# Patient Record
Sex: Female | Born: 1995 | Race: Black or African American | Hispanic: No | Marital: Single | Smoking: Never smoker
Health system: Southern US, Community
[De-identification: ages and names within clinical notes are randomized; demographics above are authoritative.]

## PROBLEM LIST (undated history)

## (undated) DIAGNOSIS — G43909 Migraine, unspecified, not intractable, without status migrainosus: Secondary | ICD-10-CM

## (undated) HISTORY — DX: Migraine, unspecified, not intractable, without status migrainosus: G43.909

## (undated) HISTORY — PX: NO PAST SURGERIES: SHX2092

---
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
## 2019-07-29 ENCOUNTER — Encounter: Payer: Self-pay | Admitting: Internal Medicine

## 2019-08-01 ENCOUNTER — Telehealth: Payer: Self-pay

## 2019-08-01 NOTE — Telephone Encounter (Signed)
Called patient to do their pre-visit COVID screening.  Call went to voicemail. Unable to do prescreening.  

## 2019-08-04 ENCOUNTER — Encounter: Payer: Self-pay | Admitting: Internal Medicine

## 2019-08-04 ENCOUNTER — Ambulatory Visit (INDEPENDENT_AMBULATORY_CARE_PROVIDER_SITE_OTHER): Payer: 59 | Admitting: Internal Medicine

## 2019-08-04 ENCOUNTER — Other Ambulatory Visit: Payer: Self-pay

## 2019-08-04 VITALS — BP 119/73 | HR 79 | Temp 97.2°F | Resp 17 | Ht 63.0 in | Wt 206.0 lb

## 2019-08-04 DIAGNOSIS — M25572 Pain in left ankle and joints of left foot: Secondary | ICD-10-CM | POA: Diagnosis not present

## 2019-08-04 DIAGNOSIS — H9201 Otalgia, right ear: Secondary | ICD-10-CM

## 2019-08-04 DIAGNOSIS — Z7689 Persons encountering health services in other specified circumstances: Secondary | ICD-10-CM

## 2019-08-04 MED ORDER — MELOXICAM 7.5 MG PO TABS: 7.5000 mg | ORAL_TABLET | Freq: Every day | ORAL | 2 refills | Status: DC | PRN

## 2019-08-04 NOTE — Progress Notes (Signed)
Subjective:    Jenna Jacobson - 24 y.o. female MRN [id number]  Date of birth: 1995/09/02  HPI  Jenna Jacobson is to establish care. Patient has a PMH significant for none.   Patient is endorsing ear pain. Present on right side. Present since December. Feels like she has water left in the ear. Thought it was ear wax and has been using ear cleaner stuff--Durex drops without relief. Did not go swimming, use bathtub or hot tub. Denies postnasal drip, rhinorrhea, congestion. Occasional headache.   Ankle Pain: Twisted her left ankle in Oct while skipping and it rolled up underneath her. Pain is located on lateral aspect. If she walks on it a long time or flexes her foot she feels the pain. Never sought medical care for this. Wearing a brace made it worse. Used ice/heat with some improvement.    ROS per HPI     Health Maintenance:  Health Maintenance Due  Topic Date Due  . HIV Screening  08/27/2008  . TETANUS/TDAP  08/27/2012  . PAP-Cervical Cytology Screening  08/28/2014  . PAP SMEAR-Modifier  08/28/2014  . INFLUENZA VACCINE  01/18/2019     Past Medical History: There are no problems to display for this patient.     Social History   reports that she has never smoked. She has never used smokeless tobacco. She reports current alcohol use.   Family History  family history is not on file.   Medications: reviewed and updated   Objective:   Physical Exam BP 119/73   Pulse 79   Temp (!) 97.2 F (36.2 C) (Temporal)   Resp 17   Ht 5\' 3"  (1.6 m)   Wt 206 lb (93.4 kg)   SpO2 99%   BMI 36.49 kg/m  Physical Exam  Constitutional: She is oriented to person, place, and time and well-developed, well-nourished, and in no distress. No distress.  HENT:  Head: Normocephalic and atraumatic.  Right Ear: External ear normal.  Left Ear: External ear normal.  Left ear with some minimal cerumen but able to visualize TM. Right canal impacted with cerumen; unable to visualize TM. No signs of  acute otitis externa bilaterally. After disimpaction, right TM visualized and normal.   Eyes: Conjunctivae and EOM are normal.  Cardiovascular: Normal rate, regular rhythm and normal heart sounds.  No murmur heard. Pulmonary/Chest: Effort normal and breath sounds normal. No respiratory distress.  Musculoskeletal:        General: Normal range of motion.     Comments: Left ankle without edema or erythema. Good joint stability. Full ROM and strength. Pain is re-created with flexion and lateral deviation of toes.  Neurological: She is alert and oriented to person, place, and time.  Skin: Skin is warm and dry. She is not diaphoretic.  Psychiatric: Affect and judgment normal.        Assessment & Plan:    1. Encounter to establish care  2. Ear pain, right Suspect related to cerumen impaction. Disimpaction performed by Allyne Gee, CMA and symptoms resolved. Follow up prn.   3. 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 Ankle joint feels stable and patient able to maintain full strength and ROM. NSAIDs prescribed to decrease inflammation. Encouraged PT evaluation. Patient would like to think about it prior to committing.  - meloxicam (MOBIC) 7.5 MG tablet; Take 1 tablet (7.5 mg total) by mouth daily as needed for pain.  Dispense: 30 tablet;  Refill: 2    Marcy Siren, D.O. 08/04/2019, 9:49 AM Primary [facility] at [facility]

## 2019-08-04 NOTE — Patient Instructions (Addendum)
**Note Jenna-Identified via Obfuscation** I would take the Mobic once per day for two weeks to decrease inflammation and then take it as needed if you have a particularly painful day with your ankle.    Peroneal Tendinopathy  Peroneal tendinopathy is irritation of the tendons that pass behind your ankle (peroneal tendons). These tendons attach muscles in your foot to a bone on the side of your foot and underneath the arch of your foot. This condition can cause your peroneal tendons to get bigger and swell. What are the causes? This condition may be caused by:  Putting stress on your ankle over and over again (overuse injury).  A sudden injury that puts stress on your tendons, such as an ankle sprain. What increases the risk? You are more likely to develop this condition if you:  Have high arches.  Play sports that involve putting stress on the ankle over and over again. These sports include: ? Running. ? Dancing. ? Soccer. ? Basketball. What are the signs or symptoms? Symptoms of this condition can start suddenly or develop gradually. Symptoms of this condition include:  Pain in the back of the ankle, on the side of the foot, or in the arch of the foot.  Pain that gets worse with activity and better with rest.  Swelling.  Warmth.  Weakness in your foot or ankle. How is this diagnosed? This condition may be diagnosed based on:  Your symptoms.  Your medical history.  A physical exam. During the exam, your health care provider may move your foot and ankle and test the strength of your leg muscles.  Imaging tests, such as: ? X-rays or a CT scan to check for bone injury. ? MRI or ultrasound to check for muscle or tendon injury. How is this treated? This condition may be treated by:  Keeping your body weight off your ankle for several days.  Returning to full activity gradually.  Putting ice on your ankle to reduce swelling.  Taking NSAIDs, such as ibuprofen.  Having medicine injected into your tendon to  reduce swelling.  Wearing a removable boot or brace for ankle support.  Doing range-of-motion exercises and strengthening exercises (physical therapy) when pain and swelling improve. If the condition does not improve with treatment, or if a tendon or muscle is damaged, surgery may be needed. Follow these instructions at home: If you have a boot or brace:  Wear the boot or brace as told by your health care provider. Remove it only as told by your health care provider.  Loosen the boot or brace if your toes tingle, become numb, or turn cold and blue.  Keep the boot or brace clean.  If the boot or brace is not waterproof: ? Do not let it get wet. ? Cover it with a watertight covering when you take a bath or shower. Managing pain, stiffness, and swelling   If directed, put ice on the injured area. ? If you have a removable boot or brace, remove it as told by your health care provider. ? Put ice in a plastic bag. ? Place a towel between your skin and the bag. ? Leave the ice on for 20 minutes, 2-3 times a day.  Move your toes often to reduce stiffness and swelling.  Raise (elevate) your ankle above the level of your heart while you are sitting or lying down. Activity  Do not do activities that make pain or swelling worse.  Do exercises as told by your health care provider.  Return to  your normal activities as told by your health care provider. Ask your health care provider what activities are safe for you.  Ask your health care provider when it is safe to drive if you have a boot or brace on your foot. General instructions  Take over-the-counter and prescription medicines only as told by your health care provider.  Do not use any products that contain nicotine or tobacco, such as cigarettes, e-cigarettes, and chewing tobacco. These can delay healing. If you need help quitting, ask your health care provider.  Keep all follow-up visits as told by your health care provider. This  is important. How is this prevented?  Wear supportive footwear that is appropriate for your athletic activity.  Avoid athletic activities that cause swelling or pain in your ankle or foot.  See your health care provider if you have pain or swelling that does not improve after a few days of rest.  Stop training if you develop pain or swelling.  If you start a new athletic activity, start gradually to build up your strength, endurance, and flexibility.  Warm up and stretch before being active.  Cool down and stretch after being active. Contact a health care provider if:  Your symptoms get worse.  Your symptoms do not improve in 2-4 weeks.  You develop new, unexplained symptoms. Summary  Peroneal tendinopathy is irritation of the tendons that pass behind your ankle.  This condition is caused by overuse or sudden injury to the peroneal tendon.  Symptoms include pain, swelling, warmth, and weakness in your foot or ankle.  This condition is treated with rest, ice, medicines, physical therapy, and surgery if needed. This information is not intended to replace advice given to you by your health care provider. Make sure you discuss any questions you have with your health care provider. Document Revised: 09/26/2018 Document Reviewed: 07/15/2018 Elsevier Patient Education  2020 [employer].     Thank you for choosing Primary Care at [facility] to be your medical home!    Jenna Jacobson was seen by Jenna Hollingshead, DO today.   Cabin crew primary care provider is Jenna Siren, DO.   For the best care possible, you should try to see Jenna Siren, DO whenever you come to the clinic.   We look forward to seeing you again soon!  If you have any questions about your visit today, please call us at [phone] or feel free to reach your primary care provider via MyChart.

## 2019-09-15 ENCOUNTER — Telehealth: Payer: Self-pay

## 2019-09-15 NOTE — Telephone Encounter (Signed)
Called patient to do their pre-visit COVID screening.  Call went to voicemail. Unable to do prescreening.  

## 2019-09-16 ENCOUNTER — Ambulatory Visit: Payer: 59 | Admitting: Internal Medicine

## 2019-12-26 ENCOUNTER — Ambulatory Visit: Payer: 59 | Admitting: Family Medicine

## 2019-12-29 ENCOUNTER — Other Ambulatory Visit: Payer: Self-pay

## 2019-12-29 ENCOUNTER — Ambulatory Visit: Payer: 59 | Admitting: Family Medicine

## 2020-01-02 DIAGNOSIS — M222X1 Patellofemoral disorders, right knee: Secondary | ICD-10-CM | POA: Insufficient documentation

## 2020-05-03 ENCOUNTER — Ambulatory Visit: Payer: Self-pay

## 2022-06-19 NOTE — L&D Delivery Note (Signed)
Delivery Note:   Chief Financial Officer G1P0 at [redacted]w[redacted]d  Admitting diagnosis: Post-dates pregnancy [O48.0]  First Stage: Induction of labor: Yes Onset of labor: 04/27/23 at 2300 Analgesia /Anesthesia/Pain control intrapartum: Epidural  Second Stage: Consented for waterbirth but never entered tub.   Complete dilation at 04/28/2023 at 4:30 AM Birth parent pushing self directed. Supported by FOB- Scottie FHR monitored continuously and was appropriate throughout pushing but did have notable deep variables. Infant noted to be OP.  I attempted to manually rotate the infant.  This appears successful but infant was still slow to descend.   Consented for VAVD.  Indication for operative vaginal delivery: Maternal exhaustion, fetal position  Risks of vacuum assistance were discussed in detail, including but not limited to, bleeding, infection, damage to maternal tissues, fetal cephalohematoma, inability to effect vaginal delivery of the head or shoulder dystocia that cannot be resolved by established maneuvers and need for emergency cesarean section.  Patient gave verbal consent.  Patient was examined and found to be fully dilated with fetal station of +2. The soft vacuum soft cup was positioned over the sagittal suture 3 cm anterior to posterior fontanelle.  Pressure was then increased to 500 mmHg, and the patient was instructed to push.  Pulling was administered along the pelvic curve.  3 pull was administered during 3 contractions, 0 popoffs using Kiwi extractor. I then transitioned to MityVac to see if this improved the descent.  The infant was then delivered atraumatically.  Nuchal Cord: Yes  Delivery of a Live born female  Birth Weight:   APGAR: 7, 8  Newborn Delivery   Birth date/time: 04/28/2023 09:06:00 Delivery type: Vaginal, Vacuum (Extractor)     Infant delivered in cephalic presentation, in LOA position and shoulders delivered easily. Placed on birth parent chest but noted to have poor tone and  HR and cord was clamp about 30 seconds.  Double clamped by Federico Flake, MD  Collection of cord blood for typing completed. Cord blood donation-None Venous cord blood sample-Yes - collected due to low APGAR and variable.  Ph 7.15  Third Stage: Placenta delivered Spontaneous;Expressed;Pathology with 3 vessels. Uterine tone firm, bleeding Small Uterotonics: Pitocin IV Placenta to Pathology  3rd degree laceration identified.  Episiotomy:None Local analgesia: None  Repair: 3rd, 3A Est. Blood Loss (mL):242.00  Complications: None   Mom to postpartum.  Johnn Hai to Couplet care / Skin to Skin.  Delivery Report:  Review the Delivery Report for details.     Signed: Federico Flake, MD  04/28/2023, 9:50 AM

## 2022-10-11 ENCOUNTER — Ambulatory Visit
Admission: EM | Admit: 2022-10-11 | Discharge: 2022-10-11 | Disposition: A | Discharge status: Home / Self Care | Payer: Self-pay | Attending: Internal Medicine | Admitting: Internal Medicine

## 2022-10-11 DIAGNOSIS — Z3201 Encounter for pregnancy test, result positive: Secondary | ICD-10-CM

## 2022-10-11 LAB — POCT URINE PREGNANCY: Preg Test, Ur: POSITIVE — AB

## 2022-10-11 NOTE — Discharge Instructions (Signed)
Your urine pregnancy test was positive.  Please continue prenatal vitamins and follow-up with OB/GYN for further evaluation and management.

## 2022-10-11 NOTE — ED Triage Notes (Signed)
Pt reports preg(+) at home but ob/gyn requires a result entered into the chart. Here for upreg.

## 2022-10-11 NOTE — ED Provider Notes (Signed)
[facility]    CSN: [id number] Arrival date & time: 10/11/22  0951      History   Chief Complaint Chief Complaint  Patient presents with   Possible Pregnancy    HPI Jenna Jacobson is a 27 y.o. female.   Patient presents today to receive a pregnancy test.  She reports that she took a positive pregnancy test in March.  Her last menstrual cycle was around 07/31/2022.  She states that she has been attempting to get in with OB/GYN but they are requiring a positive pregnancy test by a medical provider before they will see her.  She states that she has had a little bit of nausea and breast tenderness but otherwise has been feeling well.  Has been taking prenatal vitamins.  She denies that she takes any daily prescription medications.  She also denies that she smokes cigarettes.  Patient is not reporting any abdominal pain or abnormal vaginal bleeding.   Possible Pregnancy    Past Medical History:  Diagnosis Date   Migraines     There are no problems to display for this patient.   Past Surgical History:  Procedure Laterality Date   NO PAST SURGERIES      OB History     Gravida  1   Para      Term      Preterm      AB      Living         SAB      IAB      Ectopic      Multiple      Live Births               Home Medications    Prior to Admission medications   Medication Sig Start Date End Date Taking? Authorizing Provider  meloxicam (MOBIC) 7.5 MG tablet Take 1 tablet (7.5 mg total) by mouth daily as needed for pain. 08/04/19   Arvilla Market, MD    Family History Family History  Problem Relation Age of Onset   Hypertension Mother    Obesity Mother    Hypertension Father    Healthy Brother     Social History Social History   Tobacco Use   Smoking status: Never   Smokeless tobacco: Never  Substance Use Topics   Alcohol use: Yes     Allergies   Patient has no known allergies.   Review of Systems Review of  Systems Per HPI  Physical Exam Triage Vital Signs ED Triage Vitals [10/11/22 1023]  Enc Vitals Group     BP 133/76     Pulse Rate 84     Resp 16     Temp 98 F (36.7 C)     Temp Source Oral     SpO2 99 %     Weight      Height      Head Circumference      Peak Flow      Pain Score 0     Pain Loc      Pain Edu?      Excl. in GC?    No data found.  Updated Vital Signs BP 133/76 (BP Location: Left Arm)   Pulse 84   Temp 98 F (36.7 C) (Oral)   Resp 16   SpO2 99%   Visual Acuity Right Eye Distance:   Left Eye Distance:   Bilateral Distance:    Right Eye Near:   Left Eye  Near:    Bilateral Near:     Physical Exam Constitutional:      General: She is not in acute distress.    Appearance: Normal appearance. She is not toxic-appearing or diaphoretic.  HENT:     Head: Normocephalic and atraumatic.  Eyes:     Extraocular Movements: Extraocular movements intact.     Conjunctiva/sclera: Conjunctivae normal.  Pulmonary:     Effort: Pulmonary effort is normal.  Neurological:     General: No focal deficit present.     Mental Status: She is alert and oriented to person, place, and time. Mental status is at baseline.  Psychiatric:        Mood and Affect: Mood normal.        Behavior: Behavior normal.        Thought Content: Thought content normal.        Judgment: Judgment normal.      UC Treatments / Results  Labs (all labs ordered are listed, but only abnormal results are displayed) Labs Reviewed  POCT URINE PREGNANCY - Abnormal; Notable for the following components:      Result Value   Preg Test, Ur Positive (*)    All other components within normal limits    EKG   Radiology No results found.  Procedures Procedures (including critical care time)  Medications Ordered in UC Medications - No data to display  Initial Impression / Assessment and Plan / UC Course  I have reviewed the triage vital signs and the nursing notes.  Pertinent labs &  imaging results that were available during my care of the patient were reviewed by me and considered in my medical decision making (see chart for details).     Urine pregnancy test was positive.  Patient advised to continue prenatal vitamins and follow-up with OB/GYN for further evaluation and management.  No concerns for complications related to pregnancy at this time.  Patient verbalized understanding and was agreeable with plan. Final Clinical Impressions(s) / UC Diagnoses   Final diagnoses:  Positive urine pregnancy test     Discharge Instructions      Your urine pregnancy test was positive.  Please continue prenatal vitamins and follow-up with OB/GYN for further evaluation and management.    ED Prescriptions   None    PDMP not reviewed this encounter.   Gustavus Bryant, [place] 10/11/22 1102

## 2022-11-15 ENCOUNTER — Telehealth: Payer: Medicaid Other

## 2022-11-15 DIAGNOSIS — Z3689 Encounter for other specified antenatal screening: Secondary | ICD-10-CM

## 2022-11-15 DIAGNOSIS — Z3400 Encounter for supervision of normal first pregnancy, unspecified trimester: Secondary | ICD-10-CM | POA: Insufficient documentation

## 2022-11-15 DIAGNOSIS — Z3401 Encounter for supervision of normal first pregnancy, first trimester: Secondary | ICD-10-CM

## 2022-11-15 DIAGNOSIS — Z3402 Encounter for supervision of normal first pregnancy, second trimester: Secondary | ICD-10-CM

## 2022-11-15 MED ORDER — BLOOD PRESSURE KIT DEVI: 1.0000 | 0 refills | Status: DC | PRN

## 2022-11-15 NOTE — Progress Notes (Signed)
New OB Intake  I connected with Jenna Jacobson  on 11/15/22 at 10:15 AM EDT by MyChart Video Visit and verified that I am speaking with the correct person using two identifiers. Nurse is located at [facility] and pt is located at home.  I discussed the limitations, risks, security and privacy concerns of performing an evaluation and management service by telephone and the availability of in person appointments. I also discussed with the patient that there may be a patient responsible charge related to this service. The patient expressed understanding and agreed to proceed.  I explained I am completing New OB Intake today. We discussed EDD of 05/07/2023 that is based on LMP of 07/31/2022. Pt is G1/P0. I reviewed her allergies, medications, Medical/Surgical/OB history, and appropriate screenings. I informed her of [facility] services. [facility] information placed in AVS. Based on history, this is a low risk pregnancy.  There are no problems to display for this patient.   Concerns addressed today  Delivery Plans Plans to deliver at [facility]. Patient given information for [facility] Healthy Baby website for more information about [facility]. Patient is interested in water birth. Offered upcoming OB visit with CNM to discuss further.  MyChart/Babyscripts MyChart access verified. I explained pt will have some visits in office and some virtually. Babyscripts instructions given and order placed. Patient verifies receipt of registration text/e-mail. Account successfully created and app downloaded.  Blood Pressure Cuff/Weight Scale Blood pressure cuff ordered for patient to pick-up from [employer]. Explained after first prenatal appt pt will check weekly and document in Babyscripts.  Anatomy US Explained first scheduled [place] will be around 19 weeks. Anatomy US scheduled for 12/12/22 at 1:45pm. Pt notified to arrive at 1:30pm.  Labs Discussed Natera genetic screening with patient. Would like both  Panorama and Horizon drawn at new OB visit. Routine prenatal labs needed.  COVID Vaccine Patient has had COVID vaccine.   Is patient a CenteringPregnancy candidate?  Declined due to Enrolled in [facility]   Is patient a Mom+Baby Combined Care candidate?  Accepted   If accepted, Mom+Baby staff notified  Social Determinants of Health Food Insecurity: Patient denies food insecurity. WIC Referral: Patient is interested in referral to [facility].  Transportation: Patient denies transportation needs. Childcare: Discussed no children allowed at ultrasound appointments. Offered childcare services; patient declines childcare services at this time.  Interested in [place]? If yes, send referral and doula dot phrase.   First visit review I reviewed new OB appt with patient. I explained they will have a provider visit that includes new ob labs, listening to baby heartbeat and possible pap smear. Explained pt will be seen by Edd Jacobson, CNM at first visit; encounter routed to appropriate provider. Explained that patient will be seen by pregnancy navigator following visit with provider.   Lowry Bowl, CMA 11/15/2022  10:09 AM

## 2022-11-15 NOTE — Patient Instructions (Signed)
Safe Medications in Pregnancy   Acne:  Benzoyl Peroxide  Salicylic Acid   Backache/Headache:  Tylenol: 2 regular strength every 4 hours OR               2 Extra strength every 6 hours   Colds/Coughs/Allergies:  Benadryl (alcohol free) 25 mg every 6 hours as needed  Breath right strips  Claritin  Cepacol throat lozenges  Chloraseptic throat spray  Cold-Eeze- up to three times per day  Cough drops, alcohol free  Flonase (by prescription only)  Guaifenesin  Mucinex  Robitussin DM (plain only, alcohol free)  Saline nasal spray/drops  Sudafed (pseudoephedrine) & Actifed * use only after [redacted] weeks gestation and if you do not have high blood pressure  Tylenol  Vicks Vaporub  Zinc lozenges  Zyrtec   Constipation:  Colace  Ducolax suppositories  Fleet enema  Glycerin suppositories  Metamucil  Milk of magnesia  Miralax  Senokot  Smooth move tea   Diarrhea:  Kaopectate  Imodium A-D   *NO pepto Bismol   Hemorrhoids:  Anusol  Anusol HC  Preparation H  Tucks   Indigestion:  Tums  Maalox  Mylanta  Zantac  Pepcid   Insomnia:  Benadryl (alcohol free) 25mg every 6 hours as needed  Tylenol PM  Unisom, no Gelcaps   Leg Cramps:  Tums  MagGel   Nausea/Vomiting:  Bonine  Dramamine  Emetrol  Ginger extract  Sea bands  Meclizine  Nausea medication to take during pregnancy:  Unisom (doxylamine succinate 25 mg tablets) Take one tablet daily at bedtime. If symptoms are not adequately controlled, the dose can be increased to a maximum recommended dose of two tablets daily (1/2 tablet in the morning, 1/2 tablet mid-afternoon and one at bedtime).  Vitamin B6 100mg tablets. Take one tablet twice a day (up to 200 mg per day).   Skin Rashes:  Aveeno products  Benadryl cream or 25mg every 6 hours as needed  Calamine Lotion  1% cortisone cream   Yeast infection:  Gyne-lotrimin 7  Monistat 7    **If taking multiple medications, please check labels to avoid  duplicating the same active ingredients  **take medication as directed on the label  ** Do not exceed 4000 mg of tylenol in 24 hours  **Do not take medications that contain aspirin or ibuprofen             [place] Pediatric Providers  [place]outheast [place] ([address]) [facility] Brown, MD; Chambliss, MD; Eniola, MD; Hensel, MD; McDiarmid, MD; McIntyer, MD [address] [place], [place] [address] [phone] Mon-Fri 8:30-12:30, 1:30-5:00  Providers come to see babies during newborn hospitalization Only accepting infants of Mother's who are seen at Family Medicine Center or have siblings seen at   [facility] Center Medicaid - Yes; Tricare - Yes   [facility] Mulberry, MD [address] [place], [place] [address] [phone] Mon, Tue, Thur, Fri 8:30-5:00, Wed 10:00-7:00 (closed 1-2pm daily for lunch) Takes [place] residents with no insurance.  [place] [facility] only with Medicaid/insurance; Tricare - no  [facility] Ben-Davies, MD; Brown, MD; Chandler, MD; Ettefagh, MD; Grant, MD; Hanvey, MD; Herrin, MD; Jones,  MD; Lester, MD; McCormick, MD; McQueen, MD; Simha, MD; Stanley, MD; Stryffeler, NP [address], [place], [place] [address] [phone] Mon, Tue, Thur, Fri 8:30-5:30, Wed 9:30-5:30, Sat 8:30-12:30 Only accepting infants of first-time parents or siblings of   current patients Hospital discharge coordinator will make follow-up appointment Medicaid - yes; Tricare - yes  [place] ([address]) [facility] Cox, MD; Davis, MD; Dovico, MD; Ettefaugh, MD; Lowe, MD; Nation, MD; Slimp, MD; Sumner, MD; Williams, MD [address], [place], [place] [address] [phone] Mon-Fri 8:30-5:00, closed for lunch 12:30-1:30; Sat-Sun 10:00-1:00 Accepting Newborns with commercial insurance only, must call prior  to delivery to be accepted into  practice.  Medicaid - no, Tricare - yes   [facility] [address] [place], [place] [address] [phone] or [phone] Mon to Fri 8am to 10pm, Sat 8am to 1pm (virtual only on weekends) Only accepts Medicaid Healthy Blue pts  [facility]  Artis, MD; Coccaro, MD; Lockett Gardner, MD; Netherton, NP; Roper, MD; Wilmot, PA-C; Skinner, MD [address] [place], [place] [address] [phone] Mon-Fri 8:30-5:30 Medicaid - yes, Tricare - yes  [place] ([address]) [facility] Warner, MD [address], [place], [place] [address] [phone] Mon, Tues, Wed Fri 8:30-5:00, Sat 8:30-12:00, Closed Thursdays Accepting siblings of established patients and first time mom's if you call prenatally Medicaid- yes; Tricare - yes  [facility] Becker, PA; Hagler, MD; Quinn, PA-C; Scifres, PA; Sun, MD; Swayne, MD;  [address], [place], [place] [address] [phone] Mon-Fri 8:30-5:00, closed for lunch 1-2 Only accepting newborns of established patients Medicaid- no; Tricare - yes  [place] ([address]) [facility] Timberlake, MD; [address], [place], [place] [address] [phone] Mon-Fri 8:00-5:00 Medicaid - No; Tricare - Yes  [facility] [employer]  Brake, NP; Wharton, PA [address], [place], [place] [address] [phone] Mon-Fri 8:00-5:00 Medicaid - No, Tricare - Yes  [facility] Gay, MD; Quinlan, MD; Blatt, DNP [address] [place], [place] [address] [phone]  Mon-Fri 8:00-5:00 Medicaid - No; Tricare - Yes  [facility] [address] [place], [place] [address] [phone] Mon-Fri 8:30-5:00 (lunch 12:00-1:00) Medicaid -Yes; Tricare - Yes  [facility] Jordan, MD [address], [place], [place] [address] [phone] Mon-Fri 8:00-5:00 Seeing newborns of current patients only. No new patients Medicaid - No, Tricare - yes  [facility] Parker, MD [address] [place], [place] [address] [phone] Mon-Fri 8:00-5:00 Medicaid -yes as secondary coverage only; Tricare - yes  [facility] Brecken, PA; Christy, NP; Dees, MD; DeClaire, MD; DeWeese, MD; Hodge, PA; Smoot, NP; Summer, MD; Vapne, MD [address] [place], [place] [address] [phone] Mon-Fri 8:30-5:00, Sat 9:00-11:00 Accepts commercial insurance ONLY. Offers free prenatal information sessions for families. Medicaid - No, Tricare - Call first  [facility] Bouska, MD; Gordon, PA; Jeffery, PA; Weber, PA [address] [place] [address] [phone] Mon-Fri 7:30-5:30 Medicaid - Yes; Tricare - yes  [place] ([address] & [address])  [facility] Reese, MD [address] [place], [place] [address] [phone] Mon-Thur 8:00-6:00, closed for lunch 12-2, closed Fridays Medicaid - yes; Tricare - no  [facility] Anderson, NP; Badger, MD; Beal, PA; Spencer, PA [address], [place], [place] [address] [phone] Mon-Fri 7:30-4:30 Medicaid - yes, Tricare - yes  [facility]  Agbuya, MD; Klett, NP; Romgoolam, MD; Rothstein, NP [address], , [place] [address] [phone] Mon-Fri 8:30-5:00, closed for lunch 1-2, Sat 8:30-12:00 - sick visits only Providers come to   see babies at [facility] Only accepting newborns of siblings and first time parents ONLY if who have met with office prior to delivery Medicaid -Yes; Tricare - yes  [facility] [place]  Golden, DO; Friddle, NP; Wallace, MD; Wood, MD:  [address], [place], [place] [address] [phone] Mon- Fri 8:00-5:00, Sat 9:00-12:00 -  sick visits only Accepting siblings of established patients and first time mom/baby Medicaid - Yes; Tricare - yes Patients must have vaccinations (baby vaccines)  [place] ([address] & [address])  [facility] [address] [place], [place] [address] [phone] Mon-Fri 8:00-5:00 Medicaid - no; Tricare - yes  [facility] Briscoe, MD; Schmidt, PA; Moreira, PA [address], [place], [place] [address] [phone] Mon-Fri 8:00-5:00 Medicaid- yes; Tricare - yes  [facility] Boyd, MD; Jones, NP; Osborn, PA [address], , [place] [address] [phone] Mon-Fri 8:00-5:00 Medicaid - Yes; Tricare - yes  [place] ([address])  [facility] Atkinson, PA; Calderon, PA; Cummings, MD; Dillard, MD; Henrish, NP; Isenhour, DO; Martin, PA; Olson, MD; Ott, MD; Phillips, MD; Valente, PA; VanDeven, PA; Yonjof, NP [address], [place], [place] [address] [phone] Mon-Fri 8:30-5:00, Sat 9:00-12:00 - sick only Please register online [email or web address] then schedule online or call office Medicaid-Yes; Tricare -yes  [facility]  Dabrusco, MD; Dial, MD; Kouts, MD; Fleenor, NP; Goolsby, PA; Tonuzi, MD; Turner, NP; West, MD [address], [place], [place] [address] [phone] Mon-Fri 8:00-5:30, Sat&Sun by appointment (phones open at 8:30) Medicaid - Yes; Tricare - yes  [place] ([address]) [facility] Allen, CPNP; Bates, MD; Gordon, MD; Mills, NP; Weinshilboum, DO [address], [place], [place] [address] [phone] M-F 8:00 - 5:15, Sat/Sun 9-12 sick visits only Medicaid - No; Tricare - yes  [facility] [place] [facility]  Brown, PA-C; Cowen, PA-C; Dennis, DO; Fuster, PA-C; Martin, PA-C;  Shelton, PA-C; Spry, MD [address] [place] [address] [place], [place] [address] [phone] Mon-Thur 8:00-7:00, Fri 8:00-5:00 Accepting Medicaid for 13 and under only   [facility] [place] [facility] [place] Hayes, FNP; List, FNP; Moran, MD; Pitonzo, PA-C; Scholer, MD; Spangle, FNP; Nzenwa, FNP; Jasper, MD; Moran, MD [address] [place], [place] [address] [phone] Mon-Fri 8:30-5:30 Medicaid - Yes; Tricare - yes  [facility] [address] [facility]  Kelly, CPNP; Logan, MD; Poth, MD; Ramadoss, MD; Staton, NP [address], [place], [place] [address] [phone] Mon-Thur 8:00-5:30, Fri 8:00-5:00, Sat 9:00-12:00 Medicaid - yes, Tricare - yes  [place] ([address])  [facility] [place] Masneri, DO; Meyers, MD; Nelson, PA [address], [place], [place] [address] [phone] Mon-Fri 8:00-5:00, closed for lunch 12-1 Medicaid - No; Tricare - yes  [facility] [place] McGowen, MD [address], [place], [place] [address] [phone] Mon-Fri 8:00-5:00 Medicaid - No; Tricare - yes  [facility] [place] MacDonald, MD; Nayak, MD; Kearns, MD; Jones, MD [address] [place] [address], [place], [place] [address] [phone] Mon-Fri 8:00-5:00 Medicaid- Yes; Tricare - yes  [place] ([address])  [facility] [place] Martin, PA-C; Tabori, MD [address], [place],    [address] [phone] Mon-Fri 8:00-5:00 Medicaid - No; Tricare - yes  [facility] [place]  Margin - CPNP [address], [place], [place] [address] [phone] Mon-Weds 8:00-6:00, Thurs-Fri 8:00-5:00, Sat 9:00-12:00 Medicaid - yes; Tricare - yes   [facility] [place] Aubuchon, MD; Brandon, PA [address] [place], [place] [address] [phone] Mon-Fri 8:00-5:00 Medicaid - yes; Tricare - yes  [place] Pediatric Providers  [facility] [address], [place], [place] [address] [phone] M, Thur: 8am -8pm, Tues, Weds: 8am - 5pm; Fri: 8-1 Medicaid - Yes; Tricare - yes  [facility] Mertz, MD; Johnson, MD; Wells, MD; Downs, PA; Hockenberger, PA [address], [place], [place] [address] [phone] M-F 8:30 - 5:00 Medicaid - Call office; Tricare -yes  [facility] Bonney, MD; Page, MD, Minter, MD; Mueller, PNP; Thomason, NP [address], [place], [place] [address] [phone] M-F 8:30 - 5:00, Sat/Sun 8:30 - 12:30 (sick visits) Medicaid - Call office; Tricare -yes  [facility] Lewis, MD; Shaub, PNP; Boylston, MD; Quaile, PA; Nonato, NP; Landon, CPNP [address], [place], [place] [address] [phone] M-F 8:30 - 5:00 Medicaid - Call office; Tricare - yes  [facility] [place] Cline, MD; Dvergsten, MD; Flores, MD; Kawatu, MD; Nogo, MD [address], [place], [place] [address] [phone] M-Thur: 8:00 - 5:00; Fri: 8:00 - 4:00 Medicaid - yes; Tricare - yes  [facility] [address] [place], [place], [place] [address] [phone] M-F: 8:30- 5:00, closed for lunch 12:30 - 1:00 Medicaid - yes; Tricare -yes  [facility] [place] [address], [place], [place] [address] [phone] M-F 8:00 - 5:00 Medicaid - yes; Tricare - yes  [facility] Johnson, DO; Rumball, DO; Wicker, NP [address] [place], [place], [place] [address] [phone] M-F 8:00 - 5:00, Closed 12-1 for lunch Medicaid - Call; Tricare - yes  [facility] Stein, MD [address], [place], [place] [address] [phone] M-F: 8:00-5:00, Sat: 8:00 - noon Medicaid - call; Tricare -yes  [place] [facility] Providers  [facility] Collins, FNP-C [address], [place], [place] [address] [phone] M-W: 8:00-5:00, Thur:  8:00 - 7:00, Fri: 8:00 - noon Medicaid - yes; Tricare - yes  [facility] [place] Adams, FNP [address], [place], [place] [address] [phone] M-F 8:00 - 5:00, Closed for lunch 12-1 Medicaid - yes; Tricare - yes  [place] [facility] Providers  [facility] Smith, FNP, Melvin, MD, Fay, FNP-C [address] [place] [address] [place], [place], [place] [address] [phone] M-T 8:00-5:00, Wed-Fri 7:00-6:00 Medicaid - Yes; Tricare -yes  [facility] [place] Civiletti, DO; [address], [place], [place] [address] [phone] M-F 8:00 - 5:00, closed for lunch 12-1 Medicaid - Yes; Tricare - yes  [facility]  Barnes, MD; Bergdolt, MD; Caulfield, MD; Emrich, MD; Fiscus, MD; Hoppens, MD; Kylstra, MD, McPherson, MD; Todd, MD; Prestwood, MD; Waters, MD; Wood, MD [address], [place], [place] [address] [phone] M-F 8:00-5:00 Medicaid - yes; Tricare - yes  [facility] Cheema, MD (speaks Punjabi and Hindi) [address] [place], [place] [address] [phone] M-F: 8:30 - 5:00, closed 12:30 - 1 for lunch Medicaid - Yes; Tricare -yes  [place] [facility] Providers  [facility] Loda, MD; Timberlake, MD;   Burke, MD [address], [place], [place] [address] [phone] M-Th: 8:00 - 5:30, Fri: 8:00 - 12:00 Medicaid - yes; Tricare - yes  [facility] [place] Lookabill, NP; Meier, MD; Daffron, MD [address], [place], [place] [address] [phone] M-F: 8:00 - 5:00 Medicaid - yes; Tricare - yes  [facility] Busse, NP; Bowman, NP; Baune, NP; Entwistle, MD; Williams, MD, Huffman, NP, Ferguson, MD; Patel, DO [address], [place], [place] [address] [phone] M-F: 8:30 - 5:30p Medicaid - yes; Tricare - yes Other locations available as well  [place]  [facility] Rajan, MD; Wilson, MD; Morgan, PA-C, Domenech, PA-C; Myers, PA-C [address], [place], [place] [address] [phone] M-W: 8:00am - 7:00pm, Thurs: 8:00am - 8:00pm; Fri: 8:00am - 5:00pm, closed daily from 12-1 for lunch Medicaid - yes; Tricare - yes  [place] [facility] Providers  [facility] [place] Adams, MD; Crystal, FNP; Hadley, MD; Stokes, MD; Johnson, PNP; Brady, PA-C; West, PNP; Gardner, MD;  [address], [place], [place] [address] [phone] M - Fri: 8am - 5pm, Sat 9-noon Medicaid - Yes; Tricare -yes  [facility] [place] Nayak, MD; Jones, FNP; McDonald, MD; Kearns, MD [address] [place] [address], [place], [place] [phone] M-F 8:00 - 5:00 Medicaid - call; Tricare - yes  [facility] [place] Bell, MD; Emory, PNP; Pinder, MD; Anderson, MD; Light, PA-C; Johnson, MD; Latta, MD; Saul, PNP; Rainey, MD; Clifford, MD; McClung, MD [address], [place], [place] [address] [phone] M-F 8:00am - 5:00pm; Sat. 9:00 - 11:00 Medicaid - yes; Tricare - yes  [facility] [place] Soldato-Couture, MD [address], [place], [place] [address] [phone] M-F 8:00 - 5:00 Medicaid - [place] Medicaid only; Tricare - yes  [facility] [place] Walker, MD; Davis, PNP; Ajizian, MD [address] [place] [address], [place], [place] [address] [phone] M-F 8:00 - 5:00 Medicaid - yes; Tricare - yes  [facility] [place] Barry, MD; Brown, MD, Forest, MD, Hazek, MD; Hoyle, MD; Smith, MD; [address], [place], [place] [address] [phone] M-F: 8-5 Medicaid - yes; Tricare - yes  [facility] [place] Brady, Md; Dowlen, MD; [address] [place] [address], [place], [place] [address] [phone] M-F 8-5 Medicaid - yes; Tricare - yes  [facility] Kearns, MD; Nayak, MD; Soldato-Courture, MD; Pellam-Palmer,  DNP; Herring, PNP [address], [place], [place] [address] [phone] M-F 8-5 Medicaid - yes; Tricare - yes  [facility] Weathers, MD; Merritt, PA-C; Davis-PA-C; Warnimont, MD [address], [place], [place] [address] [phone] M-F 7am - 5 pm Medicaid - call; Tricare - yes  [facility] Hill, PNP; Erickson, MD; Robinson, MD [address], [place], [place] [address] [phone] M-F 8-5 Medicaid - yes; Tricare - yes  [facility] Kribbs, MD; Warner, MD; Williams, FNP; Brooks, FNP; Boles, FNP; Romblad, PA-C; [place] - FNP [address], [place],  [address] [phone] M-F 8-5 Medicaid- yes; Tricare - yes  [facility] [place] [place] [place] [facility] [place] Yoder, MD; Verenes, MD; Armentrout, MD; Stewart, MD; Beasley, CPNP; Ford, MD; Erickson, MD; Rice, MD [address], [place],  [address] [phone] M-F: 8-5, Sat: 9-4, Sun 9-12 Medicaid - yes; Tricare - yes  [facility] Little, PNP; Davis, PNP [address], [place], [place] [address] [phone] M-F 8 - 5, closed 12-1 for lunch Medicaid - yes; Tricare - yes  [facility] Friesen, MD; Cnegia, MD; Rice, MD; Patel, DO [address], [place], [place] [address] [phone] M-F 8- 5:30 Medicaid - yes; Tricare - yes  [facility] [place] Zvolensky, MD; Ray, MD; Haas, MD [address] [address], [place], [place] [address] [phone] M: 8-7; Tues-Fri: 8-5; Sat: 9-12 Medicaid - yes; Tricare - yes  [facility] [place] Heinrich, MD; Meyer, MD; Clark, MD; Rhyne, MD; Aubuchon, MD [address], [place], [place] [address] [phone] M: 8-7; Tues-Fri: 8-5; Sat: 8:30-12:30 Medicaid -  yes; Tricare - yes  [facility] [place] Bista, MD; Dillard, PA [address], [place], [place] [address] [phone] Mon-Fri: 8-5 Medicaid - yes; Tricare - yes  [facility] [place] Beasley, CPNP; Mahle, CPNP; Rice, MD; Duffy, MD; Culler, MD; [address], [place], [place] [address] [phone] M-F: 8-5, closed 1-2 for lunch Medicaid - yes; Tricare - yes  [facility] Rickman, PA; Mounce, NP; Smith, MD; Jordan, CPNP; Darty, PA; Ball, MD; Wallace, MD [address] [place] [address], [place], [place] [address] [phone] M-Thurs: 8-7; Fri: 8-6; Sat: 9-12; Sun 2-4 Medicaid - yes; Tricare - yes  [facility] Brown, MD; Shin, MD; Goodman, DNP, FNP; Sebesta, DO; [address], [place], [place] [address] [phone] M-F: 8-5 Medicaid - yes; Tricare - yes  [place] [facility] Providers  [facility] Dough, MD; Welsh, NP [address], [place], [place] [address] [phone] M - Fri: 8am - 5pm, closed for lunch 12-1 Medicaid - Yes; Tricare - yes  [facility] Manandhar, MD; Riley, MD; Sanger, DO; Vinocur, MD;Hall, PA; Walsh, PA; Campbell, NP [address], [place] [address] [phone] M-F 8:00 - 5:00, Sat 8:00 - 11:30 Medicaid - yes; Tricare - yes  [facility] Khan, MD; Redding, MD, Street, MD, Holt, MD, Burgart, MD; Rhyne, NP; Dickinson, PA;  [address] [place] [address], [place], [place] [address] [phone] M-F 8:10am - 5:00pm Medicaid - yes; Tricare - yes  [facility] Connors, MD; Kime, NP [address], [place], [place] [address] [phone] M-F 8:00 - 5:00 Medicaid - [place] Medicaid only; Tricare - yes  [facility] [place] Whyte, MD; Fox, NP [address], [place], [place] [address] [phone] M-F 8:00 - 5:00; Closed for lunch 12 - 1:00 Medicaid - yes; Tricare - yes  [facility] Penner, MD; Wilburn, FNP [address], , [place] [address] [phone] Mon 9-5; Tues/Wed 10-5; Thurs 8:30-5; Fri: 8-12:30 Medicaid - yes; Tricare - yes  [place] [facility] Providers  [facility]  Golding, MD; Jackson, PA-C [address], [place], [place] [address] [phone] phone [phone] fax M-F 7:15 - 4:30 Medicaid - yes; Tricare - yes  [facility] Gosrani, MD; Meccariello, DO [address] [place], [place] [address] [phone] M-Fri: 8:30 - 5:00, closed for lunch everyday noon - 1pm Medicaid - Yes; Tricare - yes  [facility] Burdine, MD; Daniel, MD; Howard, MD; Sasser, MD; Boles, PA; Boyd, PA-C; Carroll, PA; McGee, PA;   Skillman, PA; Wilson, PA [address] [place], [place] [address] [phone] M-Thurs: 7:30am - 7:00pm; Friday 7:30am - 4pm; Sat: 8:00 - 1:00 Medicaid - Yes; Tricare - yes  [facility] [place] Akhbari, MD; Law, MD; Qayumi, MD; Salvador, DO [address], [place], [place] [address] [phone] M-Thur: 8:00 - 5:00, Fri: 8:00 - Noon Medicaid - yes; Tricare - yes No [facility]  [facility] [place] [facility] Dettinger, MD; Gottschalk, DO; Hawks, NP; Martin, NP; Morgan, NP; Milian, NP; Rakes, NP; Stacks, MD; Webster, PA [address], [place], [place] [address] [phone] M-F 8:00 - 5:00 Medicaid - yes; Tricare - yes  [facility] Collins, FNP-C; Bucio, FNP-C [address], [place], [place] [address] [phone] M, W, R 8:00-5:00, Tues: 8:00am - 7:00pm; Fri 8:00 - noon Medicaid - Yes; Tricare - yes  [facility] Khan, MD [address]  [place] [address] [place], [place] [address] [phone]  M-Thurs 8:30-5:30, Fri: 8:30-12:30pm Medicaid - Yes; Tricare - N      Considering Waterbirth? Guide for patients at [facility] Why consider waterbirth? Gentle birth for babies  Less pain medicine used in labor  May allow for passive descent/less pushing  May reduce perineal tears  More mobility and instinctive maternal position changes  Increased maternal relaxation   Is waterbirth safe? What are the risks of infection, drowning or other complications? Infection:  Very low risk (3.7 % for tub vs 4.8% for bed)  7 in 8000 waterbirths with documented infection  Poorly cleaned equipment most common cause  Slightly lower group B strep transmission rate  Drowning  Maternal:  Very low risk  Related to seizures or fainting  Newborn:  Very low risk. No evidence of increased risk of respiratory problems in multiple large studies  Physiological protection from breathing under water  Avoid underwater birth if there are any fetal complications  Once baby's head is out of the water, keep it out.  Birth complication  Some reports of cord trauma, but risk decreased by bringing baby to surface gradually  No evidence of increased risk of shoulder dystocia. Mothers can usually change positions faster in water than in a bed, possibly aiding the maneuvers to free the shoulder.   There are 2 things you MUST do to have a waterbirth with [facility]: Attend a waterbirth class at [facility]   3rd Wednesday of every month from 7-9 pm (virtual during COVID) Free Register online at [email or web address] or [email or web address] or by calling [phone] Bring us the certificate from the class to your prenatal appointment or send via MyChart Meet with a midwife at 36 weeks* to see if you can still plan a waterbirth and to sign the consent.   *We also recommend that you schedule as many of  your prenatal visits with a midwife as possible.    Helpful information: You may want to bring a bathing suit top to the hospital to wear during labor but this is optional.  All other supplies are provided by the hospital. Please arrive at the hospital with signs of active labor, and do not wait at home until late in labor. It takes 45 min- 1 hour for fetal monitoring, and check in to your room to take place, plus transport and filling of the waterbirth tub.    Things that would prevent you from having a waterbirth:   Premature, <37wks  Previous cesarean birth  Presence of thick meconium-stained fluid  Multiple gestation (Twins, triplets, etc.)  Uncontrolled diabetes or gestational diabetes requiring medication  Hypertension diagnosed in pregnancy or preexisting hypertension (gestational hypertension, preeclampsia, or chronic hypertension) Fetal growth restriction (your baby measures less than 10th percentile on ultrasound) Heavy vaginal bleeding  Non-reassuring fetal heart rate  Active infection (MRSA, etc.). Group B Strep is NOT a contraindication for waterbirth.  If your labor has to be induced and induction method requires continuous monitoring of the baby's heart rate  Other risks/issues identified by your obstetrical provider   Please remember that birth is unpredictable. Under certain unforeseeable circumstances your provider may advise against giving birth in the tub. These decisions will be made on a case-by-case basis and with the safety of you and your baby as our highest priority.     

## 2022-11-22 ENCOUNTER — Encounter: Payer: Self-pay | Admitting: Certified Nurse Midwife

## 2022-11-23 ENCOUNTER — Other Ambulatory Visit: Payer: Self-pay

## 2022-11-23 ENCOUNTER — Other Ambulatory Visit (HOSPITAL_COMMUNITY)
Admission: RE | Admit: 2022-11-23 | Discharge: 2022-11-23 | Disposition: A | Discharge status: Home / Self Care | Payer: Medicaid Other | Source: Ambulatory Visit | Attending: Certified Nurse Midwife | Admitting: Certified Nurse Midwife

## 2022-11-23 ENCOUNTER — Ambulatory Visit (INDEPENDENT_AMBULATORY_CARE_PROVIDER_SITE_OTHER): Payer: Medicaid Other | Admitting: Family Medicine

## 2022-11-23 VITALS — BP 111/76 | HR 68 | Wt 214.6 lb

## 2022-11-23 DIAGNOSIS — Z3402 Encounter for supervision of normal first pregnancy, second trimester: Secondary | ICD-10-CM | POA: Diagnosis not present

## 2022-11-23 NOTE — Progress Notes (Signed)
Subjective:   Jenna Jacobson is a 27 y.o. G1P0 at [redacted]w[redacted]d by LMP being seen today for her first obstetrical visit.  Her obstetrical history is significant for  n/a . Patient does intend to breast feed. Pregnancy history fully reviewed.  Patient reports no complaints.  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     chlorhexidine (PERIDEX) 0.12 % solution RINSE MOUTH WITH FOR 30 SECS AM AND PM AFTER BRUSHING.SPIT AFTER RINSING, DO NOT SWALLOW (Patient not taking: Reported on 11/15/2022)     meloxicam (MOBIC) 7.5 MG tablet Take 1 tablet (7.5 mg total) by mouth daily as needed for pain. (Patient not taking: Reported on 11/15/2022) 30 tablet 2   No current facility-administered medications on file prior to visit.     Exam   Vitals:   11/23/22 1521  BP: 111/76  Pulse: 68  Weight: 214 lb 9.6 oz (97.3 kg)   Fetal Heart Rate (bpm): 153  Uterus:     Pelvic Exam: Perineum: no hemorrhoids, normal perineum   Vulva:  normal external genitalia, no lesions   Vagina:  normal mucosa mild amount of white discharge but denies any symptoms   Cervix: no lesions and normal, pap smear done.   System: General: well-developed, well-nourished female in no acute distress   Skin: normal coloration and turgor, no rashes   Neurologic: oriented, normal, negative, normal mood   Extremities: normal strength, tone, and muscle mass, ROM of all joints is normal   HEENT PERRLA, extraocular movement intact and sclera clear, anicteric   Neck supple and no masses   Respiratory:  no respiratory distress      Assessment:   Pregnancy: G1P0 Patient Active Problem List   Diagnosis Date Noted   Supervision of low-risk first pregnancy 11/15/2022     Plan:  1. Encounter for supervision of low-risk first pregnancy in second trimester BP and FHR normal Initial labs drawn. Continue prenatal vitamins. Genetic Screening discussed, NIPS: ordered. Ultrasound discussed; fetal anatomic survey: ordered. Problem list reviewed and updated. The nature of [facility] was explained to patient; Voiced they may need to be seen by other [facility] providers which includes family medicine physicians, OB GYNs, and APPs. Delivery will hopefully be with one of the [facility] providers or another Family  Medicine physician and we cannot promise this at this time.  Discussed there are [facility] staff in the hospital 24-7 and they understand and support this model and there is a likelihood one of these providers will catch their baby.  We also discussed that the service includes learners (residents, student) and they will be involved in the care team.  Routine obstetric precautions reviewed. Return in 4 weeks (on 12/21/2022) for [facility] patient, ob visit.

## 2022-11-23 NOTE — Patient Instructions (Signed)
Second Trimester of Pregnancy  The second trimester of pregnancy is from week 13 through week 27. This is months 4 through 6 of pregnancy. The second trimester is often a time when you feel your best. Your body has adjusted to being pregnant, and you begin to feel better physically. During the second trimester: Morning sickness has lessened or stopped completely. You may have more energy. You may have an increase in appetite. The second trimester is also a time when the unborn baby (fetus) is growing rapidly. At the end of the sixth month, the fetus may be up to 12 inches long and weigh about 1 pounds. You will likely begin to feel the baby move (quickening) between 16 and 20 weeks of pregnancy. Body changes during your second trimester Your body continues to go through many changes during your second trimester. The changes vary and generally return to normal after the baby is born. Physical changes Your weight will continue to increase. You will notice your lower abdomen bulging out. You may begin to get stretch marks on your hips, abdomen, and breasts. Your breasts will continue to grow and to become tender. Dark spots or blotches (chloasma or mask of pregnancy) may develop on your face. A dark line from your belly button to the pubic area (linea nigra) may appear. You may have changes in your hair. These can include thickening of your hair, rapid growth, and changes in texture. Some people also have hair loss during or after pregnancy, or hair that feels dry or thin. Health changes You may develop headaches. You may have heartburn. You may develop constipation. You may develop hemorrhoids or swollen, bulging veins (varicose veins). Your gums may bleed and may be sensitive to brushing and flossing. You may urinate more often because the fetus is pressing on your bladder. You may have back pain. This is caused by: Weight gain. Pregnancy hormones that are relaxing the joints in your  pelvis. A shift in weight and the muscles that support your balance. Follow these instructions at home: Medicines Follow your health care provider's instructions regarding medicine use. Specific medicines may be either safe or unsafe to take during pregnancy. Do not take any medicines unless approved by your health care provider. Take a prenatal vitamin that contains at least 600 micrograms (mcg) of folic acid. Eating and drinking Eat a healthy diet that includes fresh fruits and vegetables, whole grains, good sources of protein such as meat, eggs, or tofu, and low-fat dairy products. Avoid raw meat and unpasteurized juice, milk, and cheese. These carry germs that can harm you and your baby. You may need to take these actions to prevent or treat constipation: Drink enough fluid to keep your urine pale yellow. Eat foods that are high in fiber, such as beans, whole grains, and fresh fruits and vegetables. Limit foods that are high in fat and processed sugars, such as fried or sweet foods. Activity Exercise only as directed by your health care provider. Most people can continue their usual exercise routine during pregnancy. Try to exercise for 30 minutes at least 5 days a week. Stop exercising if you develop contractions in your uterus. Stop exercising if you develop pain or cramping in the lower abdomen or lower back. Avoid exercising if it is very hot or humid or if you are at a high altitude. Avoid heavy lifting. If you choose to, you may have sex unless your health care provider tells you not to. Relieving pain and discomfort Wear a supportive   bra to prevent discomfort from breast tenderness. Take warm sitz baths to soothe any pain or discomfort caused by hemorrhoids. Use hemorrhoid cream if your health care provider approves. Rest with your legs raised (elevated) if you have leg cramps or low back pain. If you develop varicose veins: Wear support hose as told by your health care  provider. Elevate your feet for 15 minutes, 3-4 times a day. Limit salt in your diet. Safety Wear your seat belt at all times when driving or riding in a car. Talk with your health care provider if someone is verbally or physically abusive to you. Lifestyle Do not use hot tubs, steam rooms, or saunas. Do not douche. Do not use tampons or scented sanitary pads. Avoid cat litter boxes and soil used by cats. These carry germs that can cause birth defects in the baby and possibly loss of the fetus by miscarriage or stillbirth. Do not use herbal remedies, alcohol, illegal drugs, or medicines that are not approved by your health care provider. Chemicals in these products can harm your baby. Do not use any products that contain nicotine or tobacco, such as cigarettes, e-cigarettes, and chewing tobacco. If you need help quitting, ask your health care provider. General instructions During a routine prenatal visit, your health care provider will do a physical exam and other tests. He or she will also discuss your overall health. Keep all follow-up visits. This is important. Ask your health care provider for a referral to a local prenatal education class. Ask for help if you have counseling or nutritional needs during pregnancy. Your health care provider can offer advice or refer you to specialists for help with various needs. Where to find more information American Pregnancy Association: americanpregnancy.org American College of Obstetricians and Gynecologists: acog.org/en/Womens%20Health/Pregnancy Office on Women's Health: womenshealth.gov/pregnancy Contact a health care provider if you have: A headache that does not go away when you take medicine. Vision changes or you see spots in front of your eyes. Mild pelvic cramps, pelvic pressure, or nagging pain in the abdominal area. Persistent nausea, vomiting, or diarrhea. A bad-smelling vaginal discharge or foul-smelling urine. Pain when you  urinate. Sudden or extreme swelling of your face, hands, ankles, feet, or legs. A fever. Get help right away if you: Have fluid leaking from your vagina. Have spotting or bleeding from your vagina. Have severe abdominal cramping or pain. Have difficulty breathing. Have chest pain. Have fainting spells. Have not felt your baby move for the time period told by your health care provider. Have new or increased pain, swelling, or redness in an arm or leg. Summary The second trimester of pregnancy is from week 13 through week 27 (months 4 through 6). Do not use herbal remedies, alcohol, illegal drugs, or medicines that are not approved by your health care provider. Chemicals in these products can harm your baby. Exercise only as directed by your health care provider. Most people can continue their usual exercise routine during pregnancy. Keep all follow-up visits. This is important. This information is not intended to replace advice given to you by your health care provider. Make sure you discuss any questions you have with your health care provider. Document Revised: 11/12/2019 Document Reviewed: 09/18/2019 Elsevier Patient Education  2024 Elsevier Inc.  Contraception Choices Contraception, also called birth control, refers to methods or devices that prevent pregnancy. Hormonal methods  Contraceptive implant A contraceptive implant is a thin, plastic tube that contains a hormone that prevents pregnancy. It is different from an intrauterine device (  IUD). It is inserted into the upper part of the arm by a health care provider. Implants can be effective for up to 3 years. Progestin-only injections Progestin-only injections are injections of progestin, a synthetic form of the hormone progesterone. They are given every 3 months by a health care provider. Birth control pills Birth control pills are pills that contain hormones that prevent pregnancy. They must be taken once a day, preferably at  the same time each day. A prescription is needed to use this method of contraception. Birth control patch The birth control patch contains hormones that prevent pregnancy. It is placed on the skin and must be changed once a week for three weeks and removed on the fourth week. A prescription is needed to use this method of contraception. Vaginal ring A vaginal ring contains hormones that prevent pregnancy. It is placed in the vagina for three weeks and removed on the fourth week. After that, the process is repeated with a new ring. A prescription is needed to use this method of contraception. Emergency contraceptive Emergency contraceptives prevent pregnancy after unprotected sex. They come in pill form and can be taken up to 5 days after sex. They work best the sooner they are taken after having sex. Most emergency contraceptives are available without a prescription. This method should not be used as your only form of birth control. Barrier methods  Female condom A female condom is a thin sheath that is worn over the penis during sex. Condoms keep sperm from going inside a woman's body. They can be used with a sperm-killing substance (spermicide) to increase their effectiveness. They should be thrown away after one use. Female condom A female condom is a soft, loose-fitting sheath that is put into the vagina before sex. The condom keeps sperm from going inside a woman's body. They should be thrown away after one use. Diaphragm A diaphragm is a soft, dome-shaped barrier. It is inserted into the vagina before sex, along with a spermicide. The diaphragm blocks sperm from entering the uterus, and the spermicide kills sperm. A diaphragm should be left in the vagina for 6-8 hours after sex and removed within 24 hours. A diaphragm is prescribed and fitted by a health care provider. A diaphragm should be replaced every 1-2 years, after giving birth, after gaining more than 15 lb (6.8 kg), and after pelvic  surgery. Cervical cap A cervical cap is a round, soft latex or plastic cup that fits over the cervix. It is inserted into the vagina before sex, along with spermicide. It blocks sperm from entering the uterus. The cap should be left in place for 6-8 hours after sex and removed within 48 hours. A cervical cap must be prescribed and fitted by a health care provider. It should be replaced every 2 years. Sponge A sponge is a soft, circular piece of polyurethane foam with spermicide in it. The sponge helps block sperm from entering the uterus, and the spermicide kills sperm. To use it, you make it wet and then insert it into the vagina. It should be inserted before sex, left in for at least 6 hours after sex, and removed and thrown away within 30 hours. Spermicides Spermicides are chemicals that kill or block sperm from entering the cervix and uterus. They can come as a cream, jelly, suppository, foam, or tablet. A spermicide should be inserted into the vagina with an applicator at least 10-15 minutes before sex to allow time for it to work. The process must be   repeated every time you have sex. Spermicides do not require a prescription. Intrauterine contraception Intrauterine device (IUD) An IUD is a T-shaped device that is put in a woman's uterus. There are two types: Hormone IUD.This type contains progestin, a synthetic form of the hormone progesterone. This type can stay in place for 3-5 years. Copper IUD.This type is wrapped in copper wire. It can stay in place for 10 years. Permanent methods of contraception Female tubal ligation In this method, a woman's fallopian tubes are sealed, tied, or blocked during surgery to prevent eggs from traveling to the uterus. Hysteroscopic sterilization In this method, a small, flexible insert is placed into each fallopian tube. The inserts cause scar tissue to form in the fallopian tubes and block them, so sperm cannot reach an egg. The procedure takes about 3  months to be effective. Another form of birth control must be used during those 3 months. Female sterilization This is a procedure to tie off the tubes that carry sperm (vasectomy). After the procedure, the man can still ejaculate fluid (semen). Another form of birth control must be used for 3 months after the procedure. Natural planning methods Natural family planning In this method, a couple does not have sex on days when the woman could become pregnant. Calendar method In this method, the woman keeps track of the length of each menstrual cycle, identifies the days when pregnancy can happen, and does not have sex on those days. Ovulation method In this method, a couple avoids sex during ovulation. Symptothermal method This method involves not having sex during ovulation. The woman typically checks for ovulation by watching changes in her temperature and in the consistency of cervical mucus. Post-ovulation method In this method, a couple waits to have sex until after ovulation. Where to find more information Centers for Disease Control and Prevention: www.cdc.gov Summary Contraception, also called birth control, refers to methods or devices that prevent pregnancy. Hormonal methods of contraception include implants, injections, pills, patches, vaginal rings, and emergency contraceptives. Barrier methods of contraception can include female condoms, female condoms, diaphragms, cervical caps, sponges, and spermicides. There are two types of IUDs (intrauterine devices). An IUD can be put in a woman's uterus to prevent pregnancy for 3-5 years. Permanent sterilization can be done through a procedure for males and females. Natural family planning methods involve nothaving sex on days when the woman could become pregnant. This information is not intended to replace advice given to you by your health care provider. Make sure you discuss any questions you have with your health care provider. Document  Revised: 11/10/2019 Document Reviewed: 11/10/2019 Elsevier Patient Education  2024 Elsevier Inc.  

## 2022-11-25 LAB — URINE CULTURE, OB REFLEX

## 2022-11-25 LAB — CULTURE, OB URINE

## 2022-11-27 LAB — CYTOLOGY - PAP
Chlamydia: NEGATIVE
Comment: NEGATIVE
Comment: NEGATIVE
Comment: NORMAL
Diagnosis: NEGATIVE
Neisseria Gonorrhea: NEGATIVE
Trichomonas: NEGATIVE

## 2022-11-28 LAB — CBC/D/PLT+RPR+RH+ABO+RUBIGG...
Antibody Screen: NEGATIVE
Basophils Absolute: 0 10*3/uL (ref 0.0–0.2)
Basos: 0 %
EOS (ABSOLUTE): 0.1 10*3/uL (ref 0.0–0.4)
Eos: 2 %
HCV Ab: NONREACTIVE
HIV Screen 4th Generation wRfx: NONREACTIVE
Hematocrit: 34.9 % (ref 34.0–46.6)
Hemoglobin: 11.8 g/dL (ref 11.1–15.9)
Hepatitis B Surface Ag: NEGATIVE
Immature Grans (Abs): 0 10*3/uL (ref 0.0–0.1)
Immature Granulocytes: 0 %
Lymphocytes Absolute: 1.5 10*3/uL (ref 0.7–3.1)
Lymphs: 17 %
MCH: 29.3 pg (ref 26.6–33.0)
MCHC: 33.8 g/dL (ref 31.5–35.7)
MCV: 87 fL (ref 79–97)
Monocytes Absolute: 0.7 10*3/uL (ref 0.1–0.9)
Monocytes: 8 %
Neutrophils Absolute: 6.5 10*3/uL (ref 1.4–7.0)
Neutrophils: 73 %
Platelets: 215 10*3/uL (ref 150–450)
RBC: 4.03 x10E6/uL (ref 3.77–5.28)
RDW: 12.9 % (ref 11.7–15.4)
RPR Ser Ql: NONREACTIVE
Rh Factor: POSITIVE
Rubella Antibodies, IGG: 5.62 index (ref >0.99)
WBC: 8.8 10*3/uL (ref 3.4–10.8)

## 2022-11-28 LAB — HEMOGLOBIN A1C
Est. average glucose Bld gHb Est-mCnc: 108 mg/dL
Hgb A1c MFr Bld: 5.4 % (ref 4.8–5.6)

## 2022-11-28 LAB — AFP, SERUM, OPEN SPINA BIFIDA
AFP MoM: 2.4
AFP Value: 75.5 ng/mL
Gest. Age on Collection Date: 16.4 weeks
Maternal Age At EDD: 29.6 yr
OSBR Risk 1 IN: 698
Test Results:: NEGATIVE
Weight: 214 [lb_av]

## 2022-11-28 LAB — HCV INTERPRETATION

## 2022-12-04 LAB — PANORAMA PRENATAL TEST FULL PANEL:PANORAMA TEST PLUS 5 ADDITIONAL MICRODELETIONS: FETAL FRACTION: 3.5

## 2022-12-12 ENCOUNTER — Other Ambulatory Visit: Payer: Self-pay | Admitting: *Deleted

## 2022-12-12 ENCOUNTER — Ambulatory Visit: Payer: Medicaid Other | Attending: Family Medicine

## 2022-12-12 ENCOUNTER — Encounter: Payer: Self-pay | Admitting: *Deleted

## 2022-12-12 ENCOUNTER — Ambulatory Visit: Payer: Medicaid Other | Admitting: *Deleted

## 2022-12-12 VITALS — BP 122/62 | HR 77

## 2022-12-12 DIAGNOSIS — O99213 Obesity complicating pregnancy, third trimester: Secondary | ICD-10-CM

## 2022-12-12 DIAGNOSIS — Z363 Encounter for antenatal screening for malformations: Secondary | ICD-10-CM | POA: Diagnosis not present

## 2022-12-12 DIAGNOSIS — Z3401 Encounter for supervision of normal first pregnancy, first trimester: Secondary | ICD-10-CM | POA: Insufficient documentation

## 2022-12-12 DIAGNOSIS — Z3402 Encounter for supervision of normal first pregnancy, second trimester: Secondary | ICD-10-CM

## 2022-12-14 LAB — HORIZON CUSTOM: REPORT SUMMARY: NEGATIVE

## 2022-12-25 ENCOUNTER — Ambulatory Visit (INDEPENDENT_AMBULATORY_CARE_PROVIDER_SITE_OTHER): Payer: Medicaid Other | Admitting: Family Medicine

## 2022-12-25 ENCOUNTER — Other Ambulatory Visit: Payer: Self-pay

## 2022-12-25 VITALS — BP 120/80 | HR 90 | Wt 218.6 lb

## 2022-12-25 DIAGNOSIS — Z3402 Encounter for supervision of normal first pregnancy, second trimester: Secondary | ICD-10-CM

## 2022-12-25 NOTE — Progress Notes (Signed)
   PRENATAL VISIT NOTE  Subjective:  Jenna Jacobson is a 27 y.o. G1P0 at [redacted]w[redacted]d being seen today for ongoing prenatal care.  She is currently monitored for the following issues for this low-risk pregnancy and has Supervision of low-risk first pregnancy on their problem list.  Patient reports no complaints.  Contractions: Not present. Vag. Bleeding: None.  Movement: Present. Denies leaking of fluid.   The following portions of the patient's history were reviewed and updated as appropriate: allergies, current medications, past family history, past medical history, past social history, past surgical history and problem list.   Objective:   Vitals:   12/25/22 1344  BP: 120/80  Pulse: 90  Weight: 218 lb 9.6 oz (99.2 kg)    Fetal Status: Fetal Heart Rate (bpm): 150   Movement: Present     General:  Alert, oriented and cooperative. Patient is in no acute distress.  Skin: Skin is warm and dry. No rash noted.   Cardiovascular: Normal heart rate noted  Respiratory: Normal respiratory effort, no problems with respiration noted  Abdomen: Soft, gravid, appropriate for gestational age.  Pain/Pressure: Absent     Pelvic: Cervical exam deferred        Extremities: Normal range of motion.     Mental Status: Normal mood and affect. Normal behavior. Normal judgment and thought content.   Assessment and Plan:  Pregnancy: G1P0 at [redacted]w[redacted]d 1. Encounter for supervision of low-risk first pregnancy in second trimester Doing well FH appropriate No concerns today Feeling fetal movement.   Preterm labor symptoms and general obstetric precautions including but not limited to vaginal bleeding, contractions, leaking of fluid and fetal movement were reviewed in detail with the patient. Please refer to After Visit Summary for other counseling recommendations.   Return in about 4 weeks (around 01/22/2023) for Routine prenatal care, [facility].  Future Appointments  Date Time Provider Department Center   01/26/2023  8:15 AM Sue Lush, FNP [facility]  01/26/2023  8:20 AM [facility]  03/13/2023 12:30 PM [facility] US3 [facility]    Federico Flake, MD

## 2023-01-25 ENCOUNTER — Other Ambulatory Visit: Payer: Self-pay

## 2023-01-25 DIAGNOSIS — Z3401 Encounter for supervision of normal first pregnancy, first trimester: Secondary | ICD-10-CM

## 2023-01-26 ENCOUNTER — Ambulatory Visit (INDEPENDENT_AMBULATORY_CARE_PROVIDER_SITE_OTHER): Payer: Medicaid Other | Admitting: Obstetrics and Gynecology

## 2023-01-26 ENCOUNTER — Other Ambulatory Visit: Payer: Self-pay

## 2023-01-26 ENCOUNTER — Other Ambulatory Visit: Payer: Medicaid Other

## 2023-01-26 VITALS — BP 118/81 | HR 83 | Wt 225.6 lb

## 2023-01-26 DIAGNOSIS — Z3402 Encounter for supervision of normal first pregnancy, second trimester: Secondary | ICD-10-CM

## 2023-01-26 DIAGNOSIS — Z3A27 27 weeks gestation of pregnancy: Secondary | ICD-10-CM

## 2023-01-26 DIAGNOSIS — Z23 Encounter for immunization: Secondary | ICD-10-CM

## 2023-01-26 DIAGNOSIS — Z3401 Encounter for supervision of normal first pregnancy, first trimester: Secondary | ICD-10-CM

## 2023-01-26 NOTE — Progress Notes (Signed)
   Subjective:  Jenna Jacobson is a 27 y.o. G1P0 at [redacted]w[redacted]d being seen today for ongoing prenatal care.  She is currently monitored for the following issues for this low-risk pregnancy and has Supervision of low-risk first pregnancy on their problem list.  Patient reports no complaints.  Contractions: Not present. Vag. Bleeding: None.  Movement: Present. Denies leaking of fluid.   The following portions of the patient's history were reviewed and updated as appropriate: allergies, current medications, past family history, past medical history, past social history, past surgical history and problem list. Problem list updated.  Objective:   Vitals:   01/26/23 0829  BP: 118/81  Pulse: 83  Weight: 225 lb 9.6 oz (102.3 kg)    Fetal Status: Fetal Heart Rate (bpm): 147 Fundal Height: 27 cm Movement: Present     General:  Alert, oriented and cooperative. Patient is in no acute distress.  Skin: Skin is warm and dry. No rash noted.   Cardiovascular: Normal heart rate noted  Respiratory: Normal respiratory effort, no problems with respiration noted  Abdomen: Soft, gravid, appropriate for gestational age. Pain/Pressure: Absent     Pelvic: Vag. Bleeding: None     Cervical exam deferred        Extremities: Normal range of motion.     Mental Status: Normal mood and affect. Normal behavior. Normal judgment and thought content.   Urinalysis:      Assessment and Plan:  Pregnancy: G1P0 at [redacted]w[redacted]d 1. Encounter for supervision of low-risk first pregnancy in second trimester BP and FHR normal  Feeling regular fetal movement FH appropriate  2. [redacted] weeks gestation of pregnancy GTT and labs today Discussed TDAP, given today 6/25 u/s normal, EFW 44%, follow up 9/24 to confirm dates   There are no diagnoses linked to this encounter. Preterm labor symptoms and general obstetric precautions including but not limited to vaginal bleeding, contractions, leaking of fluid and fetal movement were reviewed in  detail with the patient. Please refer to After Visit Summary for other counseling recommendations.   Future Appointments  Date Time Provider Department Center  02/05/2023  3:35 PM Federico Flake, MD [facility]  03/13/2023 12:30 PM [facility]    Sue Lush, FNP

## 2023-01-27 ENCOUNTER — Other Ambulatory Visit: Payer: Self-pay | Admitting: Family Medicine

## 2023-01-27 DIAGNOSIS — Z3403 Encounter for supervision of normal first pregnancy, third trimester: Secondary | ICD-10-CM

## 2023-01-27 DIAGNOSIS — O99013 Anemia complicating pregnancy, third trimester: Secondary | ICD-10-CM

## 2023-01-27 MED ORDER — FERROUS SULFATE 324 (65 FE) MG PO TBEC: 1.0000 | DELAYED_RELEASE_TABLET | ORAL | 6 refills | Status: AC

## 2023-02-05 ENCOUNTER — Ambulatory Visit (INDEPENDENT_AMBULATORY_CARE_PROVIDER_SITE_OTHER): Payer: Medicaid Other | Admitting: Family Medicine

## 2023-02-05 ENCOUNTER — Other Ambulatory Visit: Payer: Self-pay

## 2023-02-05 VITALS — BP 131/81 | HR 116 | Wt 224.6 lb

## 2023-02-05 DIAGNOSIS — Z3403 Encounter for supervision of normal first pregnancy, third trimester: Secondary | ICD-10-CM

## 2023-02-05 DIAGNOSIS — Z3A28 28 weeks gestation of pregnancy: Secondary | ICD-10-CM

## 2023-02-05 DIAGNOSIS — O99013 Anemia complicating pregnancy, third trimester: Secondary | ICD-10-CM

## 2023-02-05 NOTE — Progress Notes (Unsigned)
   PRENATAL VISIT NOTE  Subjective:  Jenna Jacobson is a 27 y.o. G1P0 at [redacted]w[redacted]d being seen today for ongoing prenatal care.  She is currently monitored for the following issues for this low-risk pregnancy and has Supervision of low-risk first pregnancy on their problem list.  Patient reports no complaints.  Contractions: Not present. Vag. Bleeding: None.  Movement: Present. Denies leaking of fluid.   The following portions of the patient's history were reviewed and updated as appropriate: allergies, current medications, past family history, past medical history, past social history, past surgical history and problem list.   Objective:   Vitals:   02/05/23 1607  BP: 131/81  Pulse: (!) 116  Weight: 224 lb 9.6 oz (101.9 kg)    Fetal Status: Fetal Heart Rate (bpm): 142   Movement: Present     General:  Alert, oriented and cooperative. Patient is in no acute distress.  Skin: Skin is warm and dry. No rash noted.   Cardiovascular: Normal heart rate noted  Respiratory: Normal respiratory effort, no problems with respiration noted  Abdomen: Soft, gravid, appropriate for gestational age.  Pain/Pressure: Absent     Pelvic: Cervical exam deferred        Extremities: Normal range of motion.  Edema: None  Mental Status: Normal mood and affect. Normal behavior. Normal judgment and thought content.   Assessment and Plan:  Pregnancy: G1P0 at [redacted]w[redacted]d  1. Encounter for supervision of low-risk first pregnancy in second trimester BP and FHR normal  Feeling regular fetal movement, approximately 4 times daily FH appropriate (30 cm)  2. [redacted] weeks gestation of pregnancy Labs from 8/9 show mild anemia (Hb 9.9 g/dL). Continue to monitor. On ferrous sulfate, recheck anemia B panel in Sept If not improved will discussed IV fe   Preterm labor symptoms and general obstetric precautions including but not limited to vaginal bleeding, contractions, leaking of fluid and fetal movement were reviewed in detail with  the patient. Please refer to After Visit Summary for other counseling recommendations.   Return in about 2 weeks (around 02/19/2023) for Mom+Baby Combined Care, Routine prenatal care.  Future Appointments  Date Time Provider Department Center  03/13/2023 12:30 PM [facility]3 [facility]   Attestation of Supervision of Student:  I confirm that I have verified the information documented in the medical student's note and that I have also personally performed the history, physical exam and all medical decision making activities.  I have verified that all services and findings are accurately documented in this student's note; and I agree with management and plan as outlined in the documentation. I have also made any necessary editorial changes.   Federico Flake, MD Center for [facility], [facility] 02/07/2023 8:39 AM   Federico Flake, MD

## 2023-02-07 ENCOUNTER — Telehealth: Payer: Self-pay | Admitting: Family Medicine

## 2023-02-07 DIAGNOSIS — O99013 Anemia complicating pregnancy, third trimester: Secondary | ICD-10-CM | POA: Insufficient documentation

## 2023-02-07 NOTE — Telephone Encounter (Signed)
Spoke to patient about coming in for a scheduled appointment. Patient is aware of her schedule.

## 2023-02-21 ENCOUNTER — Other Ambulatory Visit: Payer: Self-pay

## 2023-02-21 ENCOUNTER — Ambulatory Visit (INDEPENDENT_AMBULATORY_CARE_PROVIDER_SITE_OTHER): Payer: Medicaid Other | Admitting: Family Medicine

## 2023-02-21 VITALS — BP 111/75 | HR 91 | Wt 226.2 lb

## 2023-02-21 DIAGNOSIS — Z3403 Encounter for supervision of normal first pregnancy, third trimester: Secondary | ICD-10-CM

## 2023-02-21 DIAGNOSIS — O99013 Anemia complicating pregnancy, third trimester: Secondary | ICD-10-CM

## 2023-02-21 DIAGNOSIS — Z3A31 31 weeks gestation of pregnancy: Secondary | ICD-10-CM

## 2023-02-21 NOTE — Progress Notes (Signed)
   PRENATAL VISIT NOTE  Subjective:  Jenna Jacobson is a 27 y.o. G1P0 at [redacted]w[redacted]d being seen today for ongoing prenatal care.  She is currently monitored for the following issues for this low-risk pregnancy and has Supervision of low-risk first pregnancy and Anemia affecting pregnancy in third trimester on their problem list.  Patient reports no complaints.  Contractions: Irritability. Vag. Bleeding: None.  Movement: Present. Denies leaking of fluid.   The following portions of the patient's history were reviewed and updated as appropriate: allergies, current medications, past family history, past medical history, past social history, past surgical history and problem list.   Objective:   Vitals:   02/21/23 1338  BP: 111/75  Pulse: 91  Weight: 226 lb 3.2 oz (102.6 kg)    Fetal Status: Fetal Heart Rate (bpm): 140   Movement: Present     General:  Alert, oriented and cooperative. Patient is in no acute distress.  Skin: Skin is warm and dry. No rash noted.   Cardiovascular: Normal heart rate noted  Respiratory: Normal respiratory effort, no problems with respiration noted  Abdomen: Soft, gravid, appropriate for gestational age.  Pain/Pressure: Absent     Pelvic: Cervical exam deferred        Extremities: Normal range of motion.     Mental Status: Normal mood and affect. Normal behavior. Normal judgment and thought content.   Assessment and Plan:  Pregnancy: G1P0 at [redacted]w[redacted]d 1. Encounter for supervision of low-risk first pregnancy in third trimester Up to date FH appropriate Questions today-none Reviewed plan for breastfeeding and NFP - Anemia Profile B  2. Anemia affecting pregnancy in third trimester Repeat Anemia panel Taking Ferrous sulfate If not improved discussed with patient IV Fe - Anemia Profile B  Preterm labor symptoms and general obstetric precautions including but not limited to vaginal bleeding, contractions, leaking of fluid and fetal movement were reviewed in detail  with the patient. Please refer to After Visit Summary for other counseling recommendations.   Return in about 2 weeks (around 03/07/2023) for Routine prenatal care, Mom+Baby Combined Care.  Future Appointments  Date Time Provider Department Center  03/13/2023 12:30 PM [facility]    Federico Flake, MD

## 2023-02-22 LAB — ANEMIA PROFILE B
Basophils Absolute: 0 10*3/uL (ref 0.0–0.2)
Basos: 0 %
EOS (ABSOLUTE): 0.1 10*3/uL (ref 0.0–0.4)
Eos: 2 %
Ferritin: 25 ng/mL (ref 15–150)
Folate: >20 ng/mL (ref >3.0)
Hematocrit: 31.7 % — ABNORMAL LOW (ref 34.0–46.6)
Hemoglobin: 10.3 g/dL — ABNORMAL LOW (ref 11.1–15.9)
Immature Grans (Abs): 0.1 10*3/uL (ref 0.0–0.1)
Immature Granulocytes: 2 %
Iron Saturation: 11 % — ABNORMAL LOW (ref 15–55)
Iron: 50 ug/dL (ref 27–159)
Lymphocytes Absolute: 1.2 10*3/uL (ref 0.7–3.1)
Lymphs: 14 %
MCH: 28.3 pg (ref 26.6–33.0)
MCHC: 32.5 g/dL (ref 31.5–35.7)
MCV: 87 fL (ref 79–97)
Monocytes Absolute: 0.7 10*3/uL (ref 0.1–0.9)
Monocytes: 8 %
Neutrophils Absolute: 6.5 10*3/uL (ref 1.4–7.0)
Neutrophils: 74 %
Platelets: 217 10*3/uL (ref 150–450)
RBC: 3.64 x10E6/uL — ABNORMAL LOW (ref 3.77–5.28)
RDW: 12.3 % (ref 11.7–15.4)
Retic Ct Pct: 2.3 % (ref 0.6–2.6)
Total Iron Binding Capacity: 447 ug/dL (ref 250–450)
UIBC: 397 ug/dL (ref 131–425)
Vitamin B-12: 421 pg/mL (ref 232–1245)
WBC: 8.7 10*3/uL (ref 3.4–10.8)

## 2023-03-07 ENCOUNTER — Ambulatory Visit (INDEPENDENT_AMBULATORY_CARE_PROVIDER_SITE_OTHER): Payer: Medicaid Other | Admitting: Family Medicine

## 2023-03-07 ENCOUNTER — Other Ambulatory Visit: Payer: Self-pay

## 2023-03-07 VITALS — BP 111/78 | HR 103 | Wt 231.3 lb

## 2023-03-07 DIAGNOSIS — O99013 Anemia complicating pregnancy, third trimester: Secondary | ICD-10-CM

## 2023-03-07 DIAGNOSIS — Z23 Encounter for immunization: Secondary | ICD-10-CM | POA: Diagnosis not present

## 2023-03-07 DIAGNOSIS — Z3A33 33 weeks gestation of pregnancy: Secondary | ICD-10-CM

## 2023-03-07 DIAGNOSIS — Z3403 Encounter for supervision of normal first pregnancy, third trimester: Secondary | ICD-10-CM

## 2023-03-07 NOTE — Progress Notes (Signed)
   PRENATAL VISIT NOTE  Subjective:  Jenna Jacobson is a 27 y.o. G1P0 at [redacted]w[redacted]d being seen today for ongoing prenatal care.  She is currently monitored for the following issues for this low-risk pregnancy and has Supervision of low-risk first pregnancy; Anemia affecting pregnancy in third trimester; and Obesity affecting pregnancy, antepartum on their problem list.  Patient reports no complaints.  Contractions: Not present. Vag. Bleeding: None.  Movement: Present. Denies leaking of fluid.   The following portions of the patient's history were reviewed and updated as appropriate: allergies, current medications, past family history, past medical history, past social history, past surgical history and problem list.   Objective:   Vitals:   03/07/23 1622  BP: 111/78  Pulse: (!) 103  Weight: 231 lb 4.8 oz (104.9 kg)    Fetal Status: Fetal Heart Rate (bpm): 143   Movement: Present     General:  Alert, oriented and cooperative. Patient is in no acute distress.  Skin: Skin is warm and dry. No rash noted.   Cardiovascular: Normal heart rate noted  Respiratory: Normal respiratory effort, no problems with respiration noted  Abdomen: Soft, gravid, appropriate for gestational age.  Pain/Pressure: Absent     Pelvic: Cervical exam deferred        Extremities: Normal range of motion.  Edema: None  Mental Status: Normal mood and affect. Normal behavior. Normal judgment and thought content.   Assessment and Plan:  Pregnancy: G1P0 at [redacted]w[redacted]d  1. Encounter for supervision of low-risk first pregnancy in third trimester Up to date FH appropriate Vigorous movement - Respiratory syncytial virus vaccine, preF, subunit, bivalent,(Abrysvo) - Flu vaccine trivalent PF, 6mos and older(Flulaval,Afluria,Fluarix,Fluzone)  - Pt is interested in waterbirth.  No contraindications at this time per chart review/patient assessment.   - Pt to enroll in class, see CNMs for most visits in the office.  - Discussed  waterbirth as option for low-risk pregnancy.  Reviewed conditions that may arise during pregnancy that will risk pt out of waterbirth including hypertension, diabetes, fetal growth restriction <10%ile, etc.   2. Anemia affecting pregnancy in third trimester Lab Results  Component Value Date   HGB 10.3 (L) 02/21/2023   HGB 9.9 (L) 01/26/2023  On oral Fe, improving levels  Preterm labor symptoms and general obstetric precautions including but not limited to vaginal bleeding, contractions, leaking of fluid and fetal movement were reviewed in detail with the patient. Please refer to After Visit Summary for other counseling recommendations.   Return in about 2 weeks (around 03/21/2023) for Routine prenatal care.  Future Appointments  Date Time Provider Department Center  03/13/2023 12:30 PM [facility]  03/21/2023  3:55 PM Federico Flake, MD [facility]  03/28/2023  3:35 PM Federico Flake, MD [facility]  04/04/2023  3:55 PM Federico Flake, MD [facility]  04/11/2023  2:35 PM Federico Flake, MD [facility]  04/18/2023 10:55 AM Federico Flake, MD [facility]    Federico Flake, MD

## 2023-03-08 DIAGNOSIS — O99210 Obesity complicating pregnancy, unspecified trimester: Secondary | ICD-10-CM | POA: Insufficient documentation

## 2023-03-09 ENCOUNTER — Encounter: Payer: Self-pay | Admitting: Family Medicine

## 2023-03-09 NOTE — Patient Instructions (Signed)
Considering Waterbirth? Guide for patients at Center for [facility]) Why consider waterbirth? Gentle birth for babies  Less pain medicine used in labor  May allow for passive descent/less pushing  May reduce perineal tears  More mobility and instinctive maternal position changes  Increased maternal relaxation   Is waterbirth safe? What are the risks of infection, drowning or other complications? Infection:  Very low risk (3.7 % for tub vs 4.8% for bed)  7 in 8000 waterbirths with documented infection  Poorly cleaned equipment most common cause  Slightly lower group B strep transmission rate  Drowning  Maternal:  Very low risk  Related to seizures or fainting  Newborn:  Very low risk. No evidence of increased risk of respiratory problems in multiple large studies  Physiological protection from breathing under water  Avoid underwater birth if there are any fetal complications  Once baby's head is out of the water, keep it out.  Birth complication  Some reports of cord trauma, but risk decreased by bringing baby to surface gradually  No evidence of increased risk of shoulder dystocia. Mothers can usually change positions faster in water than in a bed, possibly aiding the maneuvers to free the shoulder.   There are 2 things you MUST do to have a waterbirth with [facility]: Attend a waterbirth class at [facility]   3rd Wednesday of every month from 7-9 pm (virtual during COVID) [employer] at [email or web address] or [email or web address] or by calling [phone] Bring [place] the certificate from the class to your prenatal appointment or send via MyChart Meet with a midwife at 36 weeks* to see if you can still plan a waterbirth and to sign the consent.   *We also recommend that you schedule as many of your prenatal visits with a midwife as possible.    Helpful information: You may want to bring a bathing suit top to the hospital  to wear during labor but this is optional.  All other supplies are provided by the hospital. Please arrive at the hospital with signs of active labor, and do not wait at home until late in labor. It takes 45 min- 1 hour for fetal monitoring, and check in to your room to take place, plus transport and filling of the waterbirth tub.    Things that would prevent you from having a waterbirth: Premature, <37wks  Previous cesarean birth  Presence of thick meconium-stained fluid  Multiple gestation (Twins, triplets, etc.)  Uncontrolled diabetes or gestational diabetes requiring medication  Hypertension diagnosed in pregnancy or preexisting hypertension (gestational hypertension, preeclampsia, or chronic hypertension) Fetal growth restriction (your baby measures less than 10th percentile on ultrasound) Heavy vaginal bleeding  Non-reassuring fetal heart rate  Active infection (MRSA, etc.). Group B Strep is NOT a contraindication for waterbirth.  If your labor has to be induced and induction method requires continuous monitoring of the baby's heart rate  Other risks/issues identified by your obstetrical provider   Please remember that birth is unpredictable. Under certain unforeseeable circumstances your provider may advise against giving birth in the tub. These decisions will be made on a case-by-case basis and with the safety of you and your baby as our highest priority.    Updated 09/21/21

## 2023-03-13 ENCOUNTER — Ambulatory Visit: Payer: Medicaid Other | Attending: Maternal & Fetal Medicine

## 2023-03-13 ENCOUNTER — Ambulatory Visit: Payer: Medicaid Other

## 2023-03-13 DIAGNOSIS — O99213 Obesity complicating pregnancy, third trimester: Secondary | ICD-10-CM | POA: Insufficient documentation

## 2023-03-13 DIAGNOSIS — O99210 Obesity complicating pregnancy, unspecified trimester: Secondary | ICD-10-CM | POA: Insufficient documentation

## 2023-03-13 DIAGNOSIS — D259 Leiomyoma of uterus, unspecified: Secondary | ICD-10-CM

## 2023-03-13 DIAGNOSIS — O3413 Maternal care for benign tumor of corpus uteri, third trimester: Secondary | ICD-10-CM | POA: Diagnosis not present

## 2023-03-13 DIAGNOSIS — Z3A33 33 weeks gestation of pregnancy: Secondary | ICD-10-CM | POA: Diagnosis not present

## 2023-03-21 ENCOUNTER — Ambulatory Visit (INDEPENDENT_AMBULATORY_CARE_PROVIDER_SITE_OTHER): Payer: Medicaid Other | Admitting: Family Medicine

## 2023-03-21 ENCOUNTER — Other Ambulatory Visit: Payer: Self-pay

## 2023-03-21 VITALS — BP 113/77 | HR 98 | Wt 235.3 lb

## 2023-03-21 DIAGNOSIS — Z3403 Encounter for supervision of normal first pregnancy, third trimester: Secondary | ICD-10-CM

## 2023-03-21 DIAGNOSIS — O99210 Obesity complicating pregnancy, unspecified trimester: Secondary | ICD-10-CM

## 2023-03-21 DIAGNOSIS — Z3A35 35 weeks gestation of pregnancy: Secondary | ICD-10-CM

## 2023-03-21 DIAGNOSIS — O99013 Anemia complicating pregnancy, third trimester: Secondary | ICD-10-CM

## 2023-03-21 DIAGNOSIS — O99213 Obesity complicating pregnancy, third trimester: Secondary | ICD-10-CM

## 2023-03-21 NOTE — Progress Notes (Signed)
   PRENATAL VISIT NOTE  Subjective:  Jenna Jacobson is a 27 y.o. G1P0 at [redacted]w[redacted]d being seen today for ongoing prenatal care.  She is currently monitored for the following issues for this low-risk pregnancy and has Supervision of low-risk first pregnancy; Anemia affecting pregnancy in third trimester; and Obesity affecting pregnancy, antepartum on their problem list.  Patient reports no complaints.  Contractions: Irritability. Vag. Bleeding: None.  Movement: Present. Denies leaking of fluid.   The following portions of the patient's history were reviewed and updated as appropriate: allergies, current medications, past family history, past medical history, past social history, past surgical history and problem list.   Objective:   Vitals:   03/21/23 1629  BP: 113/77  Pulse: 98  Weight: 235 lb 4.8 oz (106.7 kg)    Fetal Status: Fetal Heart Rate (bpm): 155 Fundal Height: 36 cm Movement: Present  Presentation: Vertex (Leopold's)  General:  Alert, oriented and cooperative. Patient is in no acute distress.  Skin: Skin is warm and dry. No rash noted.   Cardiovascular: Normal heart rate noted  Respiratory: Normal respiratory effort, no problems with respiration noted  Abdomen: Soft, gravid, appropriate for gestational age.  Pain/Pressure: Absent     Pelvic: Cervical exam deferred        Extremities: Normal range of motion.  Edema: Trace  Mental Status: Normal mood and affect. Normal behavior. Normal judgment and thought content.   Assessment and Plan:  Pregnancy: G1P0 at [redacted]w[redacted]d 1. Anemia affecting pregnancy in third trimester Improving with oral therapy  2. Obesity affecting pregnancy, antepartum, unspecified obesity type TWG=21 lb 4.8 oz (9.662 kg)   3. Encounter for supervision of low-risk first pregnancy in third trimester Up to date Discussed WB class and process at last visit. She has signed up for the class and plans to attend this Saturday No concerns today FH appropriate Vigorous  movement Consider BSUS to confirm VTX  Preterm labor symptoms and general obstetric precautions including but not limited to vaginal bleeding, contractions, leaking of fluid and fetal movement were reviewed in detail with the patient. Please refer to After Visit Summary for other counseling recommendations.   Return for Routine prenatal care, 36wks.  Future Appointments  Date Time Provider Department Center  03/28/2023  2:55 PM Federico Flake, MD [facility]  04/04/2023  3:55 PM Federico Flake, MD [facility]  04/11/2023  2:35 PM Federico Flake, MD [facility]  04/18/2023 10:55 AM Federico Flake, MD [facility]    Federico Flake, MD

## 2023-03-28 ENCOUNTER — Other Ambulatory Visit (HOSPITAL_COMMUNITY)
Admission: RE | Admit: 2023-03-28 | Discharge: 2023-03-28 | Disposition: A | Discharge status: Home / Self Care | Payer: Medicaid Other | Source: Ambulatory Visit | Attending: Family Medicine | Admitting: Family Medicine

## 2023-03-28 ENCOUNTER — Ambulatory Visit (INDEPENDENT_AMBULATORY_CARE_PROVIDER_SITE_OTHER): Payer: Medicaid Other | Admitting: Family Medicine

## 2023-03-28 ENCOUNTER — Other Ambulatory Visit: Payer: Self-pay

## 2023-03-28 VITALS — BP 119/80 | HR 88 | Wt 239.0 lb

## 2023-03-28 DIAGNOSIS — Z3A36 36 weeks gestation of pregnancy: Secondary | ICD-10-CM

## 2023-03-28 DIAGNOSIS — Z3403 Encounter for supervision of normal first pregnancy, third trimester: Secondary | ICD-10-CM

## 2023-03-28 NOTE — Progress Notes (Signed)
   PRENATAL VISIT NOTE  Subjective:  Jenna Jacobson is a 27 y.o. G1P0 at [redacted]w[redacted]d being seen today for ongoing prenatal care.  She is currently monitored for the following issues for this low-risk pregnancy and has Supervision of low-risk first pregnancy; Anemia affecting pregnancy in third trimester; and Obesity affecting pregnancy, antepartum on their problem list.  Patient reports no bleeding, no contractions, no cramping, and no leaking.  Contractions: Irritability. Vag. Bleeding: None.  Movement: Present. Denies leaking of fluid.   The following portions of the patient's history were reviewed and updated as appropriate: allergies, current medications, past family history, past medical history, past social history, past surgical history and problem list.   Objective:   Vitals:   03/28/23 1453  BP: 119/80  Pulse: 88  Weight: 239 lb (108.4 kg)    Fetal Status: Fetal Heart Rate (bpm): 148   Movement: Present     General:  Alert, oriented and cooperative. Patient is in no acute distress.  Skin: Skin is warm and dry. No rash noted.   Cardiovascular: Normal heart rate noted  Respiratory: Normal respiratory effort, no problems with respiration noted  Abdomen: Soft, gravid, appropriate for gestational age.  Pain/Pressure: Absent     Pelvic: Cervical exam deferred        Extremities: Normal range of motion.     Mental Status: Normal mood and affect. Normal behavior. Normal judgment and thought content.   Assessment and Plan:  Pregnancy: G1P0 at [redacted]w[redacted]d 1. Encounter for supervision of low-risk first pregnancy in third trimester Continue routine prenatal care Follow-up in 1 week - Culture, beta strep (group b only) - GC/Chlamydia probe amp ([place])not at [facility]  2. [redacted] weeks gestation of pregnancy  Preterm labor symptoms and general obstetric precautions including but not limited to vaginal bleeding, contractions, leaking of fluid and fetal movement were reviewed in detail with the  patient. Please refer to After Visit Summary for other counseling recommendations.   No follow-ups on file.  Future Appointments  Date Time Provider Department Center  04/04/2023  3:55 PM Federico Flake, MD [facility]  04/11/2023  2:35 PM Federico Flake, MD [facility]  04/18/2023 10:55 AM Federico Flake, MD [facility]  04/26/2023 10:35 AM Venora Maples, MD [facility]  04/26/2023 11:35 AM [facility]2 [facility]    Celedonio Savage, MD

## 2023-03-29 LAB — GC/CHLAMYDIA PROBE AMP (~~LOC~~) NOT AT ARMC
Chlamydia: NEGATIVE
Comment: NEGATIVE
Comment: NORMAL
Neisseria Gonorrhea: NEGATIVE

## 2023-03-30 ENCOUNTER — Ambulatory Visit
Admission: EM | Admit: 2023-03-30 | Discharge: 2023-03-30 | Disposition: A | Discharge status: Home / Self Care | Payer: Medicaid Other | Attending: Family Medicine | Admitting: Family Medicine

## 2023-03-30 DIAGNOSIS — H6123 Impacted cerumen, bilateral: Secondary | ICD-10-CM | POA: Diagnosis not present

## 2023-03-30 MED ORDER — CARBAMIDE PEROXIDE 6.5 % OT SOLN
5.0000 [drp] | Freq: Once | OTIC | Status: AC
  Administered 2023-03-30: 5 [drp] via OTIC

## 2023-03-30 NOTE — Discharge Instructions (Addendum)
Discussed Colace (docusate sodium) which can be used to treat earwax buildup, also known as cerumen impaction:   How to use:  Lie on your side with the affected ear facing up, then put 1 mL (about 15 drops) of Colace into your ear. Let it sit for 15 minutes, then rinse with warm water. You can also use a stool softener capsule by cutting off the tip and using the liquid inside.   When to use:  It's best to use Colace before a shower or bath so you can wash your ears afterward.   Effectiveness:  Colace can start to break down earwax within 15 minutes. However, some studies have found that saline is equally or more effective than Colace.   Other options:  Mineral oil, sweet oil, or baby oil can also be used to treat earwax.  When purchasing Colace, you may have to ask Pharmacist for it. It is OTC (Over the counter) but sometimes kept behind counter.

## 2023-03-30 NOTE — ED Triage Notes (Signed)
"  I am having right ear pain today, it has been clogged up for about a week prior". No fever. No discharge. Pain is inside ear, not outside.

## 2023-03-30 NOTE — ED Provider Notes (Signed)
[facility]    CSN: [id number] Arrival date & time: 03/30/23  1359      History   Chief Complaint Chief Complaint  Patient presents with   Otalgia    HPI Jenna Jacobson is a 27 y.o. female , [redacted] weeks pregnant today with ear pain.Nena Jordan Patient presents for evaluation of right pain and sensation of ear fullness. Patient endorses diminished hearing.  She has been using over-the-counter Debrox drops however has not been able to extract any cerumen from her ears.  Denies fever or any other upper respiratory symptoms.  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 Authorizing Provider  ferrous sulfate 324 (65 Fe) MG TBEC Take 1 tablet (325 mg total) by mouth every other day. 01/27/23  Yes Federico Flake, MD  Prenatal Vit-Fe Fumarate-FA (PRENATAL MULTIVITAMIN) TABS tablet Take 1 tablet by mouth daily at 12 noon.   Yes [provider]    Family History Family History  Problem Relation Age of Onset   Hypertension Mother    Obesity Mother    Hypertension Father    Healthy Brother     Social History Social History   Tobacco Use   Smoking status: Never   Smokeless tobacco: Never  Vaping Use   Vaping status: Never Used  Substance Use Topics   Alcohol use: Not Currently   Drug use: Not Currently     Types: Marijuana     Allergies   Patient has no known allergies.   Review of Systems Review of Systems  HENT:  Positive for ear pain.      Physical Exam Triage Vital Signs ED Triage Vitals  Encounter Vitals Group     BP 03/30/23 1405 132/86     Systolic BP Percentile --      Diastolic BP Percentile --      Pulse Rate 03/30/23 1405 (!) 112     Resp 03/30/23 1405 20     Temp 03/30/23 1405 98.6 F (37 C)     Temp Source 03/30/23 1405 Oral     SpO2 03/30/23 1405 98 %     Weight 03/30/23 1404 235 lb (106.6 kg)     Height 03/30/23 1404 5\' 5"  (1.651 m)     Head Circumference --      Peak Flow --      Pain Score 03/30/23 1402 3     Pain Loc --      Pain Education --      Exclude from  Growth Chart --    No data found.  Updated Vital Signs BP 132/86 (BP Location: Left Arm)   Pulse (!) 108   Temp 98.6 F (37 C) (Oral)   Resp 20   Ht 5\' 5"  (1.651 m)   Wt 235 lb (106.6 kg)   LMP 07/31/2022 (Approximate)   SpO2 98%   BMI 39.11 kg/m   Visual Acuity Right Eye Distance:   Left Eye Distance:   Bilateral Distance:    Right Eye Near:   Left Eye Near:    Bilateral Near:     Physical Exam Vitals reviewed.  Constitutional:      Appearance: Normal appearance.  HENT:     Head: Normocephalic and atraumatic.     Right Ear: There is impacted cerumen.     Left Ear: There is impacted cerumen.     Nose: Nose normal.  Eyes:     Extraocular Movements: Extraocular movements intact.     Conjunctiva/sclera: Conjunctivae normal.     Pupils: Pupils are equal, round, and reactive to light.  Cardiovascular:     Rate and Rhythm: Normal rate and regular rhythm.  Pulmonary:     Effort: Pulmonary effort is normal.     Breath sounds: Normal breath sounds.  Musculoskeletal:     Cervical back: Normal range of motion and neck supple.  Skin:    General: Skin is warm and dry.  Neurological:     General: No focal deficit present.     Mental Status: She is alert.      UC  Treatments / Results  Labs (all labs ordered are listed, but only abnormal results are displayed) Labs Reviewed - No data to display  EKG   Radiology No results found.  Procedures Procedures (including critical care time)  Medications Ordered in UC Medications  carbamide peroxide (DEBROX) 6.5 % OTIC (EAR) solution 5 drop (5 drops Right EAR Given 03/30/23 1522)    Initial Impression / Assessment and Plan / UC Course  I have reviewed the triage vital signs and the nursing notes.  Pertinent labs & imaging results that were available during my care of the patient were reviewed by me and considered in my medical decision making (see chart for details).    Cerumen removal performed by nursing staff. After 3 attempts to remove cerumen from right ear procedure was discontinued.  Patient has a large amount of thick cerumen affixed to her tympanic membrane therefore is poorly visualized.  Patient did not exhibit any severe tenderness with cerumen removal therefore encouraged her to use Colace and return in 3 days to try and have irrigated again.  Left ear irrigated without any difficulty visible TM without any signs of infection.  Patient will return in 3 days after using Colace and verbalized understanding and agreement with the plan. Final Clinical Impressions(s) / UC Diagnoses   Final diagnoses:  Bilateral impacted cerumen     Discharge Instructions      Discussed Colace (docusate sodium) which can be used to treat earwax buildup, also known as cerumen impaction:   How to use:  Lie on your side with the affected ear facing up, then put 1 mL (about 15 drops) of Colace into your ear. Let it sit for 15 minutes, then rinse with warm water. You can also use a stool softener capsule by cutting off the tip and using the liquid inside.   When to use:  It's best to use Colace before a shower or bath so  you can wash your ears afterward.   Effectiveness:  Colace can start to break down  earwax within 15 minutes. However, some studies have found that saline is equally or more effective than Colace.   Other options:  Mineral oil, sweet oil, or baby oil can also be used to treat earwax.  When purchasing Colace, you may have to ask Pharmacist for it. It is OTC (Over the counter) but sometimes kept behind counter.   ED Prescriptions   None    PDMP not reviewed this encounter.   Bing Neighbors, NP 03/30/23 [phone]

## 2023-04-01 LAB — CULTURE, BETA STREP (GROUP B ONLY): Strep Gp B Culture: NEGATIVE

## 2023-04-04 ENCOUNTER — Ambulatory Visit (INDEPENDENT_AMBULATORY_CARE_PROVIDER_SITE_OTHER): Payer: Medicaid Other | Admitting: Family Medicine

## 2023-04-04 ENCOUNTER — Other Ambulatory Visit: Payer: Self-pay

## 2023-04-04 VITALS — BP 129/85 | HR 99 | Wt 237.0 lb

## 2023-04-04 DIAGNOSIS — O99210 Obesity complicating pregnancy, unspecified trimester: Secondary | ICD-10-CM

## 2023-04-04 DIAGNOSIS — Z3A36 36 weeks gestation of pregnancy: Secondary | ICD-10-CM

## 2023-04-04 DIAGNOSIS — O99013 Anemia complicating pregnancy, third trimester: Secondary | ICD-10-CM

## 2023-04-04 DIAGNOSIS — O99213 Obesity complicating pregnancy, third trimester: Secondary | ICD-10-CM

## 2023-04-04 DIAGNOSIS — Z3403 Encounter for supervision of normal first pregnancy, third trimester: Secondary | ICD-10-CM

## 2023-04-04 NOTE — Progress Notes (Unsigned)
   PRENATAL VISIT NOTE  Subjective:  Jenna Jacobson is a 27 y.o. G1P0 at [redacted]w[redacted]d being seen today for ongoing prenatal care.  She is currently monitored for the following issues for this low-risk pregnancy and has Supervision of low-risk first pregnancy; Anemia affecting pregnancy in third trimester; and Obesity affecting pregnancy, antepartum on their problem list.  Patient reports no complaints.  Contractions: Not present. Vag. Bleeding: None.  Movement: Present. Denies leaking of fluid.   The following portions of the patient's history were reviewed and updated as appropriate: allergies, current medications, past family history, past medical history, past social history, past surgical history and problem list.   Objective:   Vitals:   04/04/23 1636  BP: 129/85  Pulse: 99  Weight: 237 lb (107.5 kg)    Fetal Status: Fetal Heart Rate (bpm): 161   Movement: Present     General:  Alert, oriented and cooperative. Patient is in no acute distress.  Skin: Skin is warm and dry. No rash noted.   Cardiovascular: Normal heart rate noted  Respiratory: Normal respiratory effort, no problems with respiration noted  Abdomen: Soft, gravid, appropriate for gestational age.  Pain/Pressure: Present     Pelvic: Cervical exam deferred        Extremities: Normal range of motion.  Edema: None  Mental Status: Normal mood and affect. Normal behavior. Normal judgment and thought content.   Assessment and Plan:  Pregnancy: G1P0 at [redacted]w[redacted]d 1. Anemia affecting pregnancy in third trimester Lab Results  Component Value Date   HGB 10.3 (L) 02/21/2023   HGB 9.9 (L) 01/26/2023   HGB 11.8 11/23/2022  Improving with oral therapy  2. Obesity affecting pregnancy, antepartum, unspecified obesity type TWG=23 lb (10.4 kg)  3. Encounter for supervision of low-risk first pregnancy in third trimester Up to date Vigorous movement FH appropriate No concerns today [place] confirmed VTX today on [place] Still wants WB-- discussed  she is taking and signed up for class.   Preterm labor symptoms and general obstetric precautions including but not limited to vaginal bleeding, contractions, leaking of fluid and fetal movement were reviewed in detail with the patient. Please refer to After Visit Summary for other counseling recommendations.   Return in about 1 week (around 04/11/2023) for Routine prenatal care, [facility].  Future Appointments  Date Time Provider Department Center  04/11/2023  2:35 PM Federico Flake, MD [facility]  04/18/2023 10:55 AM Federico Flake, MD [facility]  04/26/2023 10:35 AM Venora Maples, MD [facility]  04/26/2023 11:35 AM [facility]    Federico Flake, MD

## 2023-04-09 ENCOUNTER — Encounter: Payer: Self-pay | Admitting: Family Medicine

## 2023-04-11 ENCOUNTER — Other Ambulatory Visit: Payer: Self-pay

## 2023-04-11 ENCOUNTER — Ambulatory Visit: Payer: Medicaid Other | Admitting: Family Medicine

## 2023-04-11 VITALS — BP 123/83 | HR 94 | Wt 246.6 lb

## 2023-04-11 DIAGNOSIS — O99213 Obesity complicating pregnancy, third trimester: Secondary | ICD-10-CM

## 2023-04-11 DIAGNOSIS — Z3A37 37 weeks gestation of pregnancy: Secondary | ICD-10-CM | POA: Diagnosis not present

## 2023-04-11 DIAGNOSIS — O99210 Obesity complicating pregnancy, unspecified trimester: Secondary | ICD-10-CM

## 2023-04-11 DIAGNOSIS — O99013 Anemia complicating pregnancy, third trimester: Secondary | ICD-10-CM | POA: Diagnosis not present

## 2023-04-11 DIAGNOSIS — Z3403 Encounter for supervision of normal first pregnancy, third trimester: Secondary | ICD-10-CM

## 2023-04-11 NOTE — Progress Notes (Signed)
   PRENATAL VISIT NOTE  Subjective:  Jenna Jacobson is a 27 y.o. G1P0 at [redacted]w[redacted]d being seen today for ongoing prenatal care.  She is currently monitored for the following issues for this low-risk pregnancy and has Supervision of low-risk first pregnancy; Anemia affecting pregnancy in third trimester; and Obesity affecting pregnancy, antepartum on their problem list.  Patient reports no complaints.  Contractions: Irritability. Vag. Bleeding: None.  Movement: Present. Denies leaking of fluid.   The following portions of the patient's history were reviewed and updated as appropriate: allergies, current medications, past family history, past medical history, past social history, past surgical history and problem list.   Objective:   Vitals:   04/11/23 1438  BP: 123/83  Pulse: 94  Weight: 246 lb 9.6 oz (111.9 kg)    Fetal Status: Fetal Heart Rate (bpm): 159 Fundal Height: 38 cm Movement: Present  Presentation: Vertex  General:  Alert, oriented and cooperative. Patient is in no acute distress.  Skin: Skin is warm and dry. No rash noted.   Cardiovascular: Normal heart rate noted  Respiratory: Normal respiratory effort, no problems with respiration noted  Abdomen: Soft, gravid, appropriate for gestational age.  Pain/Pressure: Absent     Pelvic: Cervical exam deferred        Extremities: Normal range of motion.  Edema: Trace  Mental Status: Normal mood and affect. Normal behavior. Normal judgment and thought content.   Assessment and Plan:  Pregnancy: G1P0 at [redacted]w[redacted]d  1. Anemia affecting pregnancy in third trimester Lab Results  Component Value Date   HGB 10.3 (L) 02/21/2023   HGB 9.9 (L) 01/26/2023   Taking ferrous sulfate   2. Obesity affecting pregnancy, antepartum, unspecified obesity type TWG=32 lb 9.6 oz (14.8 kg)   3. Encounter for supervision of low-risk first pregnancy in third trimester Took WB class Up to date Awaiting labor  - Pt interested in waterbirth and has  attended the class.  - Reviewed conditions in labor that will risk her out of water immersion including thick meconium or blood stained amniotic fluid, non-reassuring fetal status on monitor, excessive bleeding, hypertension, dizziness, use of IV meds, damaged equipment or staffing that does not allow for water immersion, etc.  - The attending midwife must be on the unit for water immersion to begin; pt understands this may delay the start of water immersion. - Reminded pt that signing consent in labor at the hospital also acknowledges they will exit the tub if the attending midwife requests. - Consent given to patient for review.  Consent will be reviewed and signed at the hospital by the waterbirth provider prior to use of the tub. - Discussed other labor support options if waterbirth becomes unavailable, including position change, freedom of movement, use of birthing ball, and/or use of hydrotherapy in the shower (dependent upon medical condition/provider discretion).    Term labor symptoms and general obstetric precautions including but not limited to vaginal bleeding, contractions, leaking of fluid and fetal movement were reviewed in detail with the patient. Please refer to After Visit Summary for other counseling recommendations.   No follow-ups on file.  Future Appointments  Date Time Provider Department Center  04/18/2023 10:55 AM Federico Flake, MD [facility]  04/26/2023 10:35 AM Venora Maples, MD [facility]  04/26/2023 11:35 AM [facility]    Federico Flake, MD

## 2023-04-13 ENCOUNTER — Ambulatory Visit
Admission: EM | Admit: 2023-04-13 | Discharge: 2023-04-13 | Disposition: A | Discharge status: Home / Self Care | Payer: Medicaid Other

## 2023-04-13 DIAGNOSIS — H6121 Impacted cerumen, right ear: Secondary | ICD-10-CM

## 2023-04-13 NOTE — ED Triage Notes (Signed)
Pt states right ear fullness. States she was here for the same a few weeks ago.  Has been using debrox at home but was not able to get any wax out.

## 2023-04-18 ENCOUNTER — Other Ambulatory Visit: Payer: Self-pay

## 2023-04-18 ENCOUNTER — Ambulatory Visit (INDEPENDENT_AMBULATORY_CARE_PROVIDER_SITE_OTHER): Payer: Medicaid Other | Admitting: Family Medicine

## 2023-04-18 VITALS — BP 112/81 | HR 76 | Wt 246.8 lb

## 2023-04-18 DIAGNOSIS — O99213 Obesity complicating pregnancy, third trimester: Secondary | ICD-10-CM

## 2023-04-18 DIAGNOSIS — Z3A38 38 weeks gestation of pregnancy: Secondary | ICD-10-CM

## 2023-04-18 DIAGNOSIS — Z3403 Encounter for supervision of normal first pregnancy, third trimester: Secondary | ICD-10-CM

## 2023-04-18 DIAGNOSIS — O99013 Anemia complicating pregnancy, third trimester: Secondary | ICD-10-CM | POA: Diagnosis not present

## 2023-04-18 DIAGNOSIS — O99210 Obesity complicating pregnancy, unspecified trimester: Secondary | ICD-10-CM

## 2023-04-18 NOTE — Progress Notes (Signed)
   PRENATAL VISIT NOTE  Subjective:  Jenna Jacobson is a 27 y.o. G1P0 at [redacted]w[redacted]d being seen today for ongoing prenatal care.  She is currently monitored for the following issues for this low-risk pregnancy and has Supervision of low-risk first pregnancy; Anemia affecting pregnancy in third trimester; and Obesity affecting pregnancy, antepartum on their problem list.  Patient reports no complaints.  Contractions: Irritability. Vag. Bleeding: None.  Movement: Present. Denies leaking of fluid.   The following portions of the patient's history were reviewed and updated as appropriate: allergies, current medications, past family history, past medical history, past social history, past surgical history and problem list.   Objective:   Vitals:   04/18/23 1139  BP: 112/81  Pulse: 76  Weight: 246 lb 12.8 oz (111.9 kg)    Fetal Status: Fetal Heart Rate (bpm): 135   Movement: Present     General:  Alert, oriented and cooperative. Patient is in no acute distress.  Skin: Skin is warm and dry. No rash noted.   Cardiovascular: Normal heart rate noted  Respiratory: Normal respiratory effort, no problems with respiration noted  Abdomen: Soft, gravid, appropriate for gestational age.  Pain/Pressure: Absent     Pelvic: Cervical exam deferred        Extremities: Normal range of motion.     Mental Status: Normal mood and affect. Normal behavior. Normal judgment and thought content.   Assessment and Plan:  Pregnancy: G1P0 at [redacted]w[redacted]d 1. Encounter for supervision of low-risk first pregnancy in third trimester Up to date Awaiting labor Vigorous movement.   2. Anemia affecting pregnancy in third trimester Lab Results  Component Value Date   HGB 10.3 (L) 02/21/2023   HGB 9.9 (L) 01/26/2023  Improved with therapy  3. Obesity affecting pregnancy, antepartum, unspecified obesity type TWG=32 lb 12.8 oz (14.9 kg)   Preterm labor symptoms and general obstetric precautions including but not limited to vaginal  bleeding, contractions, leaking of fluid and fetal movement were reviewed in detail with the patient. Please refer to After Visit Summary for other counseling recommendations.   No follow-ups on file.  Future Appointments  Date Time Provider Department Center  04/26/2023 10:35 AM Venora Maples, MD [facility]  04/26/2023 11:35 AM [facility]  05/03/2023 12:00 AM [facility] SCHED ROOM [facility] None    Federico Flake, MD

## 2023-04-24 ENCOUNTER — Encounter: Payer: Self-pay | Admitting: Family Medicine

## 2023-04-25 ENCOUNTER — Other Ambulatory Visit: Payer: Self-pay | Admitting: Advanced Practice Midwife

## 2023-04-26 ENCOUNTER — Encounter: Payer: Self-pay | Admitting: Family Medicine

## 2023-04-26 ENCOUNTER — Ambulatory Visit: Payer: Medicaid Other

## 2023-04-26 ENCOUNTER — Other Ambulatory Visit: Payer: Self-pay

## 2023-04-26 ENCOUNTER — Encounter (HOSPITAL_COMMUNITY): Payer: Self-pay

## 2023-04-26 ENCOUNTER — Telehealth (HOSPITAL_COMMUNITY): Payer: Self-pay | Admitting: *Deleted

## 2023-04-26 ENCOUNTER — Ambulatory Visit (INDEPENDENT_AMBULATORY_CARE_PROVIDER_SITE_OTHER): Payer: Medicaid Other | Admitting: Family Medicine

## 2023-04-26 VITALS — BP 128/83 | HR 93 | Wt 253.7 lb

## 2023-04-26 DIAGNOSIS — Z3403 Encounter for supervision of normal first pregnancy, third trimester: Secondary | ICD-10-CM

## 2023-04-26 DIAGNOSIS — O480 Post-term pregnancy: Secondary | ICD-10-CM

## 2023-04-26 DIAGNOSIS — Z3A40 40 weeks gestation of pregnancy: Secondary | ICD-10-CM | POA: Diagnosis not present

## 2023-04-26 DIAGNOSIS — O99213 Obesity complicating pregnancy, third trimester: Secondary | ICD-10-CM

## 2023-04-26 DIAGNOSIS — O99013 Anemia complicating pregnancy, third trimester: Secondary | ICD-10-CM | POA: Diagnosis not present

## 2023-04-26 DIAGNOSIS — O99210 Obesity complicating pregnancy, unspecified trimester: Secondary | ICD-10-CM

## 2023-04-26 NOTE — Patient Instructions (Signed)

## 2023-04-26 NOTE — Telephone Encounter (Signed)
Preadmission screen  

## 2023-04-26 NOTE — Progress Notes (Signed)
   Subjective:  Jenna Jacobson is a 27 y.o. G1P0 at [redacted]w[redacted]d being seen today for ongoing prenatal care.  She is currently monitored for the following issues for this low-risk pregnancy and has Supervision of low-risk first pregnancy; Anemia affecting pregnancy in third trimester; and Obesity affecting pregnancy, antepartum on their problem list.  Patient reports no complaints.  Contractions: Irritability. Vag. Bleeding: None.  Movement: Present. Denies leaking of fluid.   The following portions of the patient's history were reviewed and updated as appropriate: allergies, current medications, past family history, past medical history, past social history, past surgical history and problem list. Problem list updated.  Objective:   Vitals:   04/26/23 1057  BP: 128/83  Pulse: 93  Weight: 253 lb 11.2 oz (115.1 kg)    Fetal Status: Fetal Heart Rate (bpm): 144   Movement: Present     General:  Alert, oriented and cooperative. Patient is in no acute distress.  Skin: Skin is warm and dry. No rash noted.   Cardiovascular: Normal heart rate noted  Respiratory: Normal respiratory effort, no problems with respiration noted  Abdomen: Soft, gravid, appropriate for gestational age. Pain/Pressure: Present     Pelvic: Vag. Bleeding: None     Cervical exam deferred        Extremities: Normal range of motion.  Edema: Trace  Mental Status: Normal mood and affect. Normal behavior. Normal judgment and thought content.   Urinalysis:      Assessment and Plan:  Pregnancy: G1P0 at [redacted]w[redacted]d  1. Encounter for supervision of low-risk first pregnancy in third trimester BP and FHR normal Has post dates testing scheduled for later today Induction already scheduled, orders placed today  2. Anemia affecting pregnancy in third trimester Lab Results  Component Value Date   HGB 10.3 (L) 02/21/2023   Mild, po iron  3. Obesity affecting pregnancy, antepartum, unspecified obesity type   Term labor symptoms and  general obstetric precautions including but not limited to vaginal bleeding, contractions, leaking of fluid and fetal movement were reviewed in detail with the patient. Please refer to After Visit Summary for other counseling recommendations.  Return in 7 weeks (on 06/14/2023) for Dyad patient, PP check.   Venora Maples, MD

## 2023-04-27 ENCOUNTER — Inpatient Hospital Stay (HOSPITAL_COMMUNITY): Payer: Medicaid Other | Admitting: Anesthesiology

## 2023-04-27 ENCOUNTER — Telehealth (HOSPITAL_COMMUNITY): Payer: Self-pay | Admitting: *Deleted

## 2023-04-27 ENCOUNTER — Other Ambulatory Visit: Payer: Self-pay

## 2023-04-27 ENCOUNTER — Inpatient Hospital Stay (HOSPITAL_COMMUNITY)
Admission: AD | Admit: 2023-04-27 | Discharge: 2023-04-30 | DRG: 768 | Disposition: A | Discharge status: Home / Self Care | Payer: Medicaid Other | Attending: Obstetrics and Gynecology | Admitting: Obstetrics and Gynecology

## 2023-04-27 ENCOUNTER — Encounter (HOSPITAL_COMMUNITY): Payer: Self-pay | Admitting: Obstetrics and Gynecology

## 2023-04-27 ENCOUNTER — Encounter (HOSPITAL_COMMUNITY): Payer: Self-pay | Admitting: *Deleted

## 2023-04-27 DIAGNOSIS — O99214 Obesity complicating childbirth: Secondary | ICD-10-CM | POA: Diagnosis present

## 2023-04-27 DIAGNOSIS — O480 Post-term pregnancy: Secondary | ICD-10-CM | POA: Diagnosis present

## 2023-04-27 DIAGNOSIS — D509 Iron deficiency anemia, unspecified: Secondary | ICD-10-CM | POA: Diagnosis present

## 2023-04-27 DIAGNOSIS — O99210 Obesity complicating pregnancy, unspecified trimester: Secondary | ICD-10-CM | POA: Diagnosis present

## 2023-04-27 DIAGNOSIS — Z8249 Family history of ischemic heart disease and other diseases of the circulatory system: Secondary | ICD-10-CM | POA: Diagnosis not present

## 2023-04-27 DIAGNOSIS — O9902 Anemia complicating childbirth: Secondary | ICD-10-CM | POA: Diagnosis present

## 2023-04-27 DIAGNOSIS — O99013 Anemia complicating pregnancy, third trimester: Secondary | ICD-10-CM | POA: Diagnosis present

## 2023-04-27 DIAGNOSIS — Z3403 Encounter for supervision of normal first pregnancy, third trimester: Principal | ICD-10-CM

## 2023-04-27 DIAGNOSIS — Z3A40 40 weeks gestation of pregnancy: Secondary | ICD-10-CM

## 2023-04-27 DIAGNOSIS — Z3400 Encounter for supervision of normal first pregnancy, unspecified trimester: Secondary | ICD-10-CM

## 2023-04-27 LAB — CBC
HCT: 34.3 % — ABNORMAL LOW (ref 36.0–46.0)
Hemoglobin: 10.9 g/dL — ABNORMAL LOW (ref 12.0–15.0)
MCH: 27 pg (ref 26.0–34.0)
MCHC: 31.8 g/dL (ref 30.0–36.0)
MCV: 84.9 fL (ref 80.0–100.0)
Platelets: 194 10*3/uL (ref 150–400)
RBC: 4.04 MIL/uL (ref 3.87–5.11)
RDW: 15.4 % (ref 11.5–15.5)
WBC: 11.9 10*3/uL — ABNORMAL HIGH (ref 4.0–10.5)
nRBC: 0 % (ref 0.0–0.2)

## 2023-04-27 LAB — TYPE AND SCREEN
ABO/RH(D): O POS
Antibody Screen: NEGATIVE

## 2023-04-27 MED ORDER — OXYTOCIN-SODIUM CHLORIDE 30-0.9 UT/500ML-% IV SOLN
2.5000 [IU]/h | INTRAVENOUS | Status: DC
  Administered 2023-04-28: 2.5 [IU]/h via INTRAVENOUS
  Filled 2023-04-27: qty 500

## 2023-04-27 MED ORDER — PHENYLEPHRINE 80 MCG/ML (10ML) SYRINGE FOR IV PUSH (FOR BLOOD PRESSURE SUPPORT)
80.0000 ug | PREFILLED_SYRINGE | INTRAVENOUS | Status: DC | PRN
  Filled 2023-04-27: qty 10

## 2023-04-27 MED ORDER — LIDOCAINE HCL (PF) 1 % IJ SOLN: 30.0000 mL | INTRAMUSCULAR | Status: DC | PRN

## 2023-04-27 MED ORDER — FENTANYL CITRATE (PF) 100 MCG/2ML IJ SOLN: 50.0000 ug | INTRAMUSCULAR | Status: DC | PRN

## 2023-04-27 MED ORDER — LACTATED RINGERS IV SOLN
500.0000 mL | Freq: Once | INTRAVENOUS | Status: AC
  Administered 2023-04-27: 1000 mL via INTRAVENOUS

## 2023-04-27 MED ORDER — FENTANYL-BUPIVACAINE-NACL 0.5-0.125-0.9 MG/250ML-% EP SOLN
EPIDURAL | Status: DC | PRN
  Administered 2023-04-27: 12 mL/h via EPIDURAL

## 2023-04-27 MED ORDER — ONDANSETRON HCL 4 MG/2ML IJ SOLN
4.0000 mg | Freq: Four times a day (QID) | INTRAMUSCULAR | Status: DC | PRN
  Administered 2023-04-27: 4 mg via INTRAVENOUS
  Filled 2023-04-27: qty 2

## 2023-04-27 MED ORDER — EPHEDRINE 5 MG/ML INJ: 10.0000 mg | INTRAVENOUS | Status: DC | PRN

## 2023-04-27 MED ORDER — DIPHENHYDRAMINE HCL 50 MG/ML IJ SOLN: 12.5000 mg | INTRAMUSCULAR | Status: DC | PRN

## 2023-04-27 MED ORDER — ACETAMINOPHEN 325 MG PO TABS: 650.0000 mg | ORAL_TABLET | ORAL | Status: DC | PRN

## 2023-04-27 MED ORDER — SOD CITRATE-CITRIC ACID 500-334 MG/5ML PO SOLN: 30.0000 mL | ORAL | Status: DC | PRN

## 2023-04-27 MED ORDER — FENTANYL-BUPIVACAINE-NACL 0.5-0.125-0.9 MG/250ML-% EP SOLN
12.0000 mL/h | EPIDURAL | Status: DC | PRN
  Filled 2023-04-27: qty 250

## 2023-04-27 MED ORDER — LIDOCAINE HCL (PF) 1 % IJ SOLN
INTRAMUSCULAR | Status: DC | PRN
  Administered 2023-04-27: 5 mL via EPIDURAL

## 2023-04-27 MED ORDER — PHENYLEPHRINE 80 MCG/ML (10ML) SYRINGE FOR IV PUSH (FOR BLOOD PRESSURE SUPPORT): 80.0000 ug | PREFILLED_SYRINGE | INTRAVENOUS | Status: DC | PRN

## 2023-04-27 MED ORDER — OXYTOCIN BOLUS FROM INFUSION
333.0000 mL | Freq: Once | INTRAVENOUS | Status: AC
  Administered 2023-04-28: 333 mL via INTRAVENOUS

## 2023-04-27 NOTE — Anesthesia Procedure Notes (Signed)
Epidural Patient location during procedure: OB Start time: 04/27/2023 11:36 PM End time: 04/27/2023 11:51 PM  Staffing Anesthesiologist: Trevor Iha, MD Performed: anesthesiologist   Preanesthetic Checklist Completed: patient identified, IV checked, site marked, risks and benefits discussed, surgical consent, monitors and equipment checked, pre-op evaluation and timeout performed  Epidural Patient position: sitting Prep: DuraPrep and site prepped and draped Patient monitoring: continuous pulse ox and blood pressure Approach: midline Location: L3-L4 Injection technique: LOR air  Needle:  Needle type: Tuohy  Needle gauge: 17 G Needle length: 9 cm and 9 Needle insertion depth: 7 cm Catheter type: closed end flexible Catheter size: 19 Gauge Catheter at skin depth: 13 cm Test dose: negative  Assessment Events: blood not aspirated, no cerebrospinal fluid, injection not painful, no injection resistance, no paresthesia and negative IV test  Additional Notes Patient identified. Risks/Benefits/Options discussed with patient including but not limited to bleeding, infection, nerve damage, paralysis, failed block, incomplete pain control, headache, blood pressure changes, nausea, vomiting, reactions to medication both or allergic, itching and postpartum back pain. Confirmed with bedside nurse the patient's most recent platelet count. Confirmed with patient that they are not currently taking any anticoagulation, have any bleeding history or any family history of bleeding disorders. Patient expressed understanding and wished to proceed. All questions were answered. Sterile technique was used throughout the entire procedure. Please see nursing notes for vital signs. Test dose was given through epidural needle and negative prior to continuing to dose epidural or start infusion. Warning signs of high block given to the patient including shortness of breath, tingling/numbness in hands, complete  motor block, or any concerning symptoms with instructions to call for help. Patient was given instructions on fall risk and not to get out of bed. All questions and concerns addressed with instructions to call with any issues. 1  Attempt (S) . Patient tolerated procedure well.

## 2023-04-27 NOTE — Anesthesia Preprocedure Evaluation (Signed)
Anesthesia Evaluation  Patient identified by MRN, date of birth, ID band Patient awake    Reviewed: Allergy & Precautions, NPO status , Patient's Chart, lab work & pertinent test results  Airway Mallampati: II  TM Distance: >3 FB Neck ROM: Full    Dental no notable dental hx. (+) Teeth Intact, Dental Advisory Given   Pulmonary neg pulmonary ROS   Pulmonary exam normal breath sounds clear to auscultation       Cardiovascular negative cardio ROS Normal cardiovascular exam Rhythm:Regular Rate:Normal     Neuro/Psych negative neurological ROS  negative psych ROS   GI/Hepatic negative GI ROS, Neg liver ROS,,,  Endo/Other  negative endocrine ROS    Renal/GU negative Renal ROS  negative genitourinary   Musculoskeletal negative musculoskeletal ROS (+)    Abdominal  (+) + obese (BMI 42.3)  Peds negative pediatric ROS (+)  Hematology Lab Results      Component                Value               Date                      WBC                      11.9 (H)            04/27/2023                HGB                      10.9 (L)            04/27/2023                HCT                      34.3 (L)            04/27/2023                MCV                      84.9                04/27/2023                PLT                      194                 04/27/2023              Anesthesia Other Findings   Reproductive/Obstetrics (+) Pregnancy                              Anesthesia Physical Anesthesia Plan  ASA: 3  Anesthesia Plan: Epidural   Post-op Pain Management:    Induction:   PONV Risk Score and Plan:   Airway Management Planned:   Additional Equipment:   Intra-op Plan:   Post-operative Plan:   Informed Consent: I have reviewed the patients History and Physical, chart, labs and discussed the procedure including the risks, benefits and alternatives for the proposed anesthesia with the  patient or authorized representative who has indicated his/her understanding and acceptance.  Plan Discussed with: CRNA  Anesthesia Plan Comments: (40.1 Wk Primagravida w BMI 42.3 for LEA )         Anesthesia Quick Evaluation

## 2023-04-27 NOTE — MAU Note (Signed)
Pt says UC's strong since 5pm [facility]- clinic VE 2 weeks ago- closed  Denies HSV GBS- neg

## 2023-04-27 NOTE — Telephone Encounter (Signed)
Preadmission screen  

## 2023-04-27 NOTE — H&P (Cosign Needed Addendum)
OBSTETRIC ADMISSION HISTORY AND PHYSICAL  Jenna Jacobson is a 27 y.o. female G1P0 with IUP at [redacted]w[redacted]d by 19w [place] presenting for SOL with prolonged decel and postdates. She reports +FMs, No LOF, no VB, no blurry vision, headaches or peripheral edema, and RUQ pain.  She plans on breast feeding. She will do  natural family planning  for birth control. She received her prenatal care at [facility].   Dating: By 19w [place] --->  Estimated Date of Delivery: 04/26/23 Sono:  @[redacted]w[redacted]d , normal anatomy, cephalic presentation, posterior placenta, 2464g, 65% EFW  Feeling painful contractions, significant discomfort.  Requesting epidural.  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    Past Surgical History: Past Surgical History:  Procedure Laterality Date   NO PAST SURGERIES      Obstetrical History: OB History     Gravida  1   Para      Term      Preterm      AB      Living         SAB      IAB      Ectopic      Multiple      Live Births              Social History Social History   Socioeconomic History   Marital status: Single    Spouse name: Not on file   Number of children: Not on file   Years of education: Not on file   Highest education level: Master's degree (e.g., MA, MS, MEng, MEd, MSW, MBA)  Occupational History   Not on file  Tobacco Use   Smoking status: Never   Smokeless tobacco: Never  Vaping Use   Vaping status: Never Used  Substance and Sexual Activity   Alcohol use: Not Currently   Drug use: Not Currently    Types: Marijuana   Sexual activity: Not Currently    Birth control/protection: None  Other Topics Concern   Not on file  Social History Narrative   Not on file   Social Determinants of Health   Financial Resource Strain: Patient Declined (11/23/2022)   Overall Financial Resource Strain (CARDIA)    Difficulty of Paying  Living Expenses: Patient declined  Food Insecurity: No Food Insecurity (12/25/2022)   Hunger Vital Sign    Worried About Running Out of Food in the Last Year: Never true    Ran Out of Food in the Last Year: Never true  Transportation Needs: No Transportation Needs (12/25/2022)   PRAPARE - Administrator, Civil Service (Medical): No    Lack of Transportation (Non-Medical): No  Physical Activity: Insufficiently Active (11/23/2022)   Exercise Vital Sign    Days of Exercise per Week: 4 days    Minutes of Exercise per Session: 20 min  Stress: No Stress Concern Present (11/23/2022)   [employer] of Occupational Health - Occupational Stress Questionnaire    Feeling of Stress : Not at all  Social Connections: Moderately Isolated (11/23/2022)   Social Connection and Isolation Panel [NHANES]    Frequency of Communication with Friends and Family: Once a week    Frequency of Social Gatherings with Friends and Family: More than three times a week    Attends Religious Services: Never    Database administrator or Organizations: No    Attends Banker Meetings:  Not on file    Marital Status: Living with partner    Family History: Family History  Problem Relation Age of Onset   Hypertension Mother    Obesity Mother    Hypertension Father    Healthy Brother     Allergies: No Known Allergies  Medications Prior to Admission  Medication Sig Dispense Refill Last Dose   ferrous sulfate 324 (65 Fe) MG TBEC Take 1 tablet (325 mg total) by mouth every other day. 30 tablet 6    Prenatal Vit-Fe Fumarate-FA (PRENATAL MULTIVITAMIN) TABS tablet Take 1 tablet by mouth daily at 12 noon.       Review of Systems  All systems reviewed and negative except as stated in HPI  Blood pressure 127/74, pulse 74, temperature (!) 97.4 F (36.3 C), temperature source Oral, resp. rate 16, height 5\' 5"  (1.651 m), weight 115.4 kg, last menstrual period 07/31/2022. General appearance: alert,  cooperative, and no distress Lungs: clear to auscultation bilaterally Heart: regular rate and rhythm Abdomen: soft, non-tender, gravid; bowel sounds normal Pelvic: No LOF, some bloody show Extremities: Homans sign is negative, no sign of DVT DTR's 2+  Presentation: cephalic Fetal monitoring: Baseline 130/Moderate variability/10x10 accels/No decels Uterine activity: Every 3-4 minutes Dilation: 5 Effacement (%): 90 Cervical Position: Middle Station: -2 Presentation: Vertex Exam by:: Joline Salt RN  Prenatal labs: ABO, Rh: O/Positive/-- (06/06 1640) Antibody: Negative (06/06 1640) Rubella: 5.62 (06/06 1640) RPR: Non Reactive (08/09 0820)  HBsAg: Negative (06/06 1640)  HIV: Non Reactive (08/09 0820)  GBS: Negative/-- (10/09 0433)  GTT: Normal Genetic screening: NIPS LR, Horizon neg x4, AFP WNL Anatomy US: Normal  Editor, commissioning for Women Dating by LMP c/w U/S at 19 wks  PNC Model Mom-Baby Dyad Anatomy U/S Normal, 4 wk follow up scheduled per MFM  Initiated care at  15wks                 Language  English               LAB RESULTS   Support Person FOB- Scottie, Mom Genetics NIPS: low risk             AFP:  normal                NT/IT (FT only)        Carrier Screen Horizon: neg 4/4  Rhogam  O/Positive/-- (06/06 1640) A1C/GTT Early: normal Third trimester:  Flu Vaccine 03/07/23      TDaP Vaccine  01/26/2023 Blood Type O/Positive/-- (06/06 1640)  Covid Vaccine 1 dose  Antibody Negative (06/06 1640)  RSV vaccine 03/07/23 at 33 wk Rubella 5.62 (06/06 1640)  Feeding Plan breast RPR Non Reactive (08/09 0820)  Contraception NFP HBsAg Negative (06/06 1640)  Circumcision Yes BOY HIV Non Reactive (08/09 0820)  Pediatrician  MBCC HCVAb Non Reactive (06/06 1640)  Prenatal Classes            Pap       Diagnosis  Date Value Ref Range Status  11/23/2022     Final    - Negative for intraepithelial lesion or malignancy (NILM)    BTLConsent   GC/CT  Initial:             36wks: neg/neg  VBAC  Consent   GBS Negative/-- (10/09 0433) For PCN allergy, check sensitivities            DME Rx [ ]  BP cuff [ ]  Weight Scale  Waterbirth  [ ]  Class [ ]  Consent [ ]  CNM visit  PHQ9 & GAD7 [  ] new OB [ x ] 28 weeks  [ x ] 36 weeks Induction  [ ]  Orders Entered [ ] Foley Y/N    Prenatal Transfer Tool  Maternal Diabetes: No Genetic Screening: Normal Maternal Ultrasounds/Referrals: Normal Fetal Ultrasounds or other Referrals:  None Maternal Substance Abuse:  No Significant Maternal Medications:  None Significant Maternal Lab Results: Group B Strep negative  No results found for this or any previous visit (from the past 24 hour(s)).  Patient Active Problem List   Diagnosis Date Noted   Post-dates pregnancy 04/27/2023   Obesity affecting pregnancy, antepartum 03/08/2023   Anemia affecting pregnancy in third trimester 02/07/2023   Supervision of low-risk first pregnancy 11/15/2022    Assessment/Plan:  Jenna Jacobson is a 27 y.o. G1P0 at [redacted]w[redacted]d here for SOL with prolonged decel.  #Labor: Expectant management, consider AROM for augmentation if needed after epidural and EFM reassuring. #Pain: Epidural placement imminent. #Fetal Well Being: Cat I #ID: Group B Strep negative #Method Of Feeding: breast feeding #Method Of Contraception: natural family planning (NFP) #Circ: yes  #Presumed iron deficiency anemia: PO iron after delivery. #Single prolonged decel in MAU: No decels since arrival on L&D floor, close monitoring s/p epidural, low threshold to give phenylephrine if needed.  Jolita Haefner Sharion Dove, MD [facility] PGY-1 OB Faculty Practice 04/27/23 11:02 PM

## 2023-04-28 ENCOUNTER — Encounter (HOSPITAL_COMMUNITY): Payer: Self-pay | Admitting: Family Medicine

## 2023-04-28 DIAGNOSIS — O480 Post-term pregnancy: Secondary | ICD-10-CM

## 2023-04-28 DIAGNOSIS — O99214 Obesity complicating childbirth: Secondary | ICD-10-CM

## 2023-04-28 DIAGNOSIS — Z3A40 40 weeks gestation of pregnancy: Secondary | ICD-10-CM

## 2023-04-28 LAB — RPR: RPR Ser Ql: NONREACTIVE

## 2023-04-28 MED ORDER — ONDANSETRON HCL 4 MG/2ML IJ SOLN: 4.0000 mg | INTRAMUSCULAR | Status: DC | PRN

## 2023-04-28 MED ORDER — OXYCODONE HCL 5 MG PO TABS: 5.0000 mg | ORAL_TABLET | ORAL | Status: DC | PRN

## 2023-04-28 MED ORDER — SENNOSIDES-DOCUSATE SODIUM 8.6-50 MG PO TABS
2.0000 | ORAL_TABLET | ORAL | Status: DC
  Administered 2023-04-29 – 2023-04-30 (×2): 2 via ORAL
  Filled 2023-04-28 (×2): qty 2

## 2023-04-28 MED ORDER — FLEET ENEMA RE ENEM: 1.0000 | ENEMA | Freq: Every day | RECTAL | Status: DC | PRN

## 2023-04-28 MED ORDER — ZOLPIDEM TARTRATE 5 MG PO TABS: 5.0000 mg | ORAL_TABLET | Freq: Every evening | ORAL | Status: DC | PRN

## 2023-04-28 MED ORDER — TERBUTALINE SULFATE 1 MG/ML IJ SOLN
INTRAMUSCULAR | Status: AC
  Filled 2023-04-28: qty 1

## 2023-04-28 MED ORDER — ONDANSETRON HCL 4 MG PO TABS: 4.0000 mg | ORAL_TABLET | ORAL | Status: DC | PRN

## 2023-04-28 MED ORDER — DIBUCAINE (PERIANAL) 1 % EX OINT: 1.0000 | TOPICAL_OINTMENT | CUTANEOUS | Status: DC | PRN

## 2023-04-28 MED ORDER — TERBUTALINE SULFATE 1 MG/ML IJ SOLN: 0.2500 mg | Freq: Once | INTRAMUSCULAR | Status: DC | PRN

## 2023-04-28 MED ORDER — IBUPROFEN 600 MG PO TABS
600.0000 mg | ORAL_TABLET | Freq: Four times a day (QID) | ORAL | Status: DC
Start: 2023-04-28 — End: 2023-04-30
  Administered 2023-04-28 – 2023-04-30 (×8): 600 mg via ORAL
  Filled 2023-04-28 (×9): qty 1

## 2023-04-28 MED ORDER — BISACODYL 10 MG RE SUPP: 10.0000 mg | Freq: Every day | RECTAL | Status: DC | PRN

## 2023-04-28 MED ORDER — LACTATED RINGERS IV SOLN: INTRAVENOUS | Status: DC

## 2023-04-28 MED ORDER — ACETAMINOPHEN 325 MG PO TABS: 650.0000 mg | ORAL_TABLET | ORAL | Status: DC | PRN

## 2023-04-28 MED ORDER — COCONUT OIL OIL: 1.0000 | TOPICAL_OIL | Status: DC | PRN

## 2023-04-28 MED ORDER — BENZOCAINE-MENTHOL 20-0.5 % EX AERO: 1.0000 | INHALATION_SPRAY | CUTANEOUS | Status: DC | PRN

## 2023-04-28 MED ORDER — WITCH HAZEL-GLYCERIN EX PADS: 1.0000 | MEDICATED_PAD | CUTANEOUS | Status: DC | PRN

## 2023-04-28 MED ORDER — PRENATAL MULTIVITAMIN CH
1.0000 | ORAL_TABLET | Freq: Every day | ORAL | Status: DC
  Administered 2023-04-28 – 2023-04-29 (×2): 1 via ORAL
  Filled 2023-04-28 (×2): qty 1

## 2023-04-28 MED ORDER — OXYCODONE HCL 5 MG PO TABS: 10.0000 mg | ORAL_TABLET | ORAL | Status: DC | PRN

## 2023-04-28 MED ORDER — DIPHENHYDRAMINE HCL 25 MG PO CAPS: 25.0000 mg | ORAL_CAPSULE | Freq: Four times a day (QID) | ORAL | Status: DC | PRN

## 2023-04-28 MED ORDER — FERROUS SULFATE 325 (65 FE) MG PO TABS
325.0000 mg | ORAL_TABLET | ORAL | Status: DC
  Administered 2023-04-28 – 2023-04-30 (×2): 325 mg via ORAL
  Filled 2023-04-28 (×2): qty 1

## 2023-04-28 MED ORDER — OXYTOCIN-SODIUM CHLORIDE 30-0.9 UT/500ML-% IV SOLN
1.0000 m[IU]/min | INTRAVENOUS | Status: DC
Start: 2023-04-28 — End: 2023-04-28
  Administered 2023-04-28: 2 m[IU]/min via INTRAVENOUS

## 2023-04-28 MED ORDER — SIMETHICONE 80 MG PO CHEW: 80.0000 mg | CHEWABLE_TABLET | ORAL | Status: DC | PRN

## 2023-04-28 NOTE — Discharge Summary (Signed)
Postpartum Discharge Summary  Date of Service updated***     Patient Name: Jenna Jacobson DOB: 10-22-1995 MRN: [id number]  Date of admission: 04/27/2023 Delivery date:04/28/2023 Delivering provider: Federico Flake Date of discharge: 04/28/2023  Admitting diagnosis: Post-dates pregnancy [O48.0] Intrauterine pregnancy: [redacted]w[redacted]d     Secondary diagnosis:  Principal Problem:   Vacuum-assisted vaginal delivery Active Problems:   Supervision of low-risk first pregnancy   Anemia affecting pregnancy in third trimester   Obesity affecting pregnancy, antepartum   Post-dates pregnancy   Obstetric vaginal laceration with type 3a third degree perineal laceration  Additional problems: ***    Discharge diagnosis: Term Pregnancy Delivered                                              Post partum procedures:{Postpartum procedures:23558} Augmentation: AROM and Pitocin Complications: None  Hospital course: Induction of Labor With Vaginal Delivery   27 y.o. yo G1P0 at [redacted]w[redacted]d was admitted to the hospital 04/27/2023 for induction of labor.  Indication for induction: Postdates and Favorable cervix at term.  Patient had an labor course complicated by deep variable decelerations and OP position.  Membrane Rupture Time/Date: 2:45 AM,04/28/2023  Delivery Method:Vaginal, Vacuum (Extractor) Operative Delivery:Device used:KIWI, MityVac Indication: Maternal exhaustion and Fetal indications Episiotomy: None Lacerations:  3rd degree Details of delivery can be found in separate delivery note.  Patient had a postpartum course complicated by***. Patient is discharged home 04/28/23.  Newborn Data: Birth date:04/28/2023 Birth time:9:06 AM Gender:Female Living status:Living Apgars:7 ,8  Weight:   Magnesium Sulfate received: No BMZ received: No Rhophylac:N/A MMR:N/A T-DaP:Given prenatally Flu: Yes RSV Vaccine received: Yes Transfusion:{Transfusion received:30440034} Immunizations  administered: Immunization History  Administered Date(s) Administered   DTP 01/25/1994, 07/05/1994, 07/17/1995   DTaP 02/10/1998   HIB (PRP-T) 01/25/1994, 07/05/1994   HIB, Unspecified 07/17/1995   Hep B, Unspecified 05-31-94, 09/29/1993, 07/05/1994   Influenza, Seasonal, Injecte, Preservative Fre 04/14/2008, 03/07/2023   MMR 07/17/1995, 02/10/1998   OPV 01/25/1994, 07/05/1994, 07/17/1995, 02/10/1998   PFIZER(Purple Top)SARS-COV-2 Vaccination 11/29/2019, 12/25/2019   Rsv, Bivalent, Protein Subunit Rsvpref,pf Verdis Frederickson) 03/07/2023   Tdap 01/26/2023    Physical exam  Vitals:   04/28/23 0910 04/28/23 0915 04/28/23 0930 04/28/23 0945  BP: 120/68 121/63 133/60 (!) 91/49  Pulse: (!) 104 97 91 (!) 107  Resp:   18 18  Temp:      TempSrc:      SpO2:      Weight:      Height:       General: {Exam; general:21111117} Lochia: {Desc; appropriate/inappropriate:30686::"appropriate"} Uterine Fundus: {Desc; firm/soft:30687} Incision: {Exam; incision:21111123} DVT Evaluation: {Exam; dvt:2111122} Labs: Lab Results  Component Value Date   WBC 11.9 (H) 04/27/2023   HGB 10.9 (L) 04/27/2023   HCT 34.3 (L) 04/27/2023   MCV 84.9 04/27/2023   PLT 194 04/27/2023       No data to display         Edinburgh Score:     No data to display            After visit meds:  Allergies as of 04/28/2023   No Known Allergies   Med Rec must be completed prior to using this [facility]***        Discharge home in stable condition Infant Feeding: {Baby feeding:23562} Infant Disposition:{CHL IP OB HOME WITH MWUXLK:44010} Discharge instruction: per After Visit Summary and  Postpartum booklet. Activity: Advance as tolerated. Pelvic rest for 6 weeks.  Diet: {OB diet:21111121} Anticipated Birth Control: {Birth Control:23956} Postpartum Appointment:{Outpatient follow up:23559} Additional Postpartum F/U: {PP Procedure:23957} Future Appointments: Future Appointments  Date Time Provider  Department Center  06/05/2023  9:15 AM Venora Maples, MD [facility]   Follow up Visit:      04/28/2023 Federico Flake, MD

## 2023-04-28 NOTE — Lactation Note (Signed)
This note was copied from a baby's chart. Lactation Consultation Note  Patient Name: Jenna Jacobson [id number] Date: 04/28/2023 Age:29 hours   P1- LC to room for initial consult. MOB was sleeping at this time. Infant was being held in FOB's arms on the couch. LC will try to see MOB again later today.   Dema Severin BS, IBCLC 04/28/2023, 2:17 PM

## 2023-04-28 NOTE — Progress Notes (Addendum)
LABOR PROGRESS NOTE  Jenna Jacobson is a 27 y.o. G2P2002 at [redacted]w[redacted]d presented for PDIOL.  Subjective: Called to room by RN given prolonged decel.  Patient on hands and knees, reportedly experienced decel with upright sitting position.  Tolerated hands and knees with good recovery of FHR.  Objective: BP 112/62 (BP Location: Right Arm)   Pulse 75   Temp 98.6 F (37 C) (Oral)   Resp 18   Ht 5\' 4"  (1.626 m)   Wt 81.3 kg   LMP 07/14/2022   SpO2 99%   Breastfeeding Unknown   BMI 30.76 kg/m Vitals:   04/27/23 2330 04/28/23 0000 04/28/23 0020 04/28/23 0125  BP: 130/77 119/65 113/63 112/62  Pulse: 68 78 82 75  Resp:   18 18  Temp:   98.9 F (37.2 C) 98.6 F (37 C)  TempSrc:   Oral Oral  SpO2:   98% 99%  Weight:      Height:       Cervical Exam Dilation: 9 Effacement (%): 100 Cervical Position: Anterior Station: 0 Presentation: Vertex Exam by:: Dr Leanora Cover  Fetal monitoring: Baseline 130/Mod variability/15x15 accels/Single 4-5 min prolonged decel w/ recovery in alternate position. Uterine activity: Every 2-3 minutes  Labs: Lab Results  Component Value Date   WBC 10.4 04/27/2023   HGB 12.5 04/27/2023   HCT 37.6 04/27/2023   MCV 92.6 04/27/2023   PLT 194 04/27/2023    Patient Active Problem List   Diagnosis Date Noted   Post term pregnancy over 40 weeks 04/27/2023   NSVD (normal spontaneous vaginal delivery) 04/27/2023   Encounter for induction of labor 12/06/2019   Post-dates pregnancy 12/06/2019    Assessment / Plan: 27 y.o. G2P2002 at [redacted]w[redacted]d here for PDIOL.  Labor: s/p AROM, making good change.  Expectant management. Fetal Wellbeing:  Cat II, however if prolonged deceleration recurs and does not resolve with intrauterine resusitative maneuvers and/or terbutaline, may go to Cesarean.  s/p 500 mL bolus.  Likely d/t rapid progress. Pain Control:  Epidural Anticipated MOD:  Vaginal #GBS negative  Dimitry Sharion Dove, MD [facility] PGY-1 OB Faculty Practice 04/28/23  3:19 AM  GME ATTESTATION:  Evaluation and management procedures were performed by the [facility] Medicine Resident under my supervision. I was immediately available for direct supervision, assistance and direction throughout this encounter.  I also confirm that I have verified the information documented in the resident's note, and that I have also personally reperformed the pertinent components of the physical exam and all of the medical decision making activities.  I have also made any necessary editorial changes.  Wyn Forster, MD OB Fellow, Faculty [facility], Center for [facility] 04/28/2023 4:17 AM

## 2023-04-28 NOTE — Progress Notes (Signed)
Update from RN/Strip Review  Patient pushed about about 1 hr with some progress. Pitocin was started to help encourage rotation and increase power.   EFM: 150/moderate/+accels but having deep variables with contractions to the 60s, recovers well between.   Overall low risk of acidemia with moderate variability and quick recovery and realize infant may not tolerate for an extended period of time.  Patient will rest but low threshold to restart pushing.  Pitocin 4mu, discussed up titration with RN per protocol  Federico Flake, MD

## 2023-04-28 NOTE — Progress Notes (Signed)
Labor Progress Note Jenna Jacobson is a 27 y.o. G1P0 at [redacted]w[redacted]d presented for SOL.   S: pushing with RN and making movement.  O:  BP 130/78   Pulse 70   Temp 98 F (36.7 C) (Oral)   Resp 17   Ht 5\' 5"  (1.651 m)   Wt 115.2 kg   LMP 07/31/2022 (Approximate)   SpO2 100%   BMI 42.27 kg/m  EFM: 145/moderate/+accels, no decels  CVE: Dilation: 10 Dilation Complete Date: 04/28/23 Dilation Complete Time: 0430 Effacement (%): 100 Cervical Position: Anterior Station: Plus 1 Presentation: Vertex Exam by:: Lyndel Safe MD   A&P: 27 y.o. G1P0 [redacted]w[redacted]d  SOL #Labor: Discussed with RN starting pitocin to augment to help with stronger contractions. Infant feels OP/Asynclitic #Pain: epidural #FWB: Cat 1 #GBS negative  Federico Flake, MD 6:35 AM

## 2023-04-28 NOTE — Lactation Note (Signed)
This note was copied from a baby's chart. Lactation Consultation Note  Patient Name: Jenna Jacobson [id number] Date: 04/28/2023 Age:29 hours Reason for consult: Initial assessment;Mother's request;Primapara;1st time breastfeeding;Term;Breastfeeding assistance  P1- MOB called out for latching assistance. MOB reported that infant has been sleepy and hasn't latched very well since birth. LC assisted MOB with placing infant on the left breast in the football hold. Infant was tongue thrusting and crying as MOB and LC attempted to latch him. We attempted to latch for 10 minutes before deciding to give infant a little more time.  LC assisted MOB with placing infant STS on her chest. LC encouraged hand expressing to offer EBM and MOB agreed. LC demonstrated how to hand express bilaterally. No colostrum noted at this time. LC reassured MOB that this can be normal and to give infant a little STS time before attempting a latch again.  LC reviewed feeding infant on cue 8-12x in 24 hrs, not allowing infant to go over 3 hrs without a feeding, CDC milk storage guidelines and LC services handout. LC encouraged MOB to call back in an hour to attempt another latch.  Maternal Data Has patient been taught Hand Expression?: Yes Does the patient have breastfeeding experience prior to this delivery?: No  Feeding Mother's Current Feeding Choice: Breast Milk  LATCH Score Latch: Too sleepy or reluctant, no latch achieved, no sucking elicited.  Audible Swallowing: None  Type of Nipple: Everted at rest and after stimulation  Comfort (Breast/Nipple): Soft / non-tender  Hold (Positioning): Full assist, staff holds infant at breast  LATCH Score: 4   Lactation Tools Discussed/Used Pump Education: Milk Storage  Interventions Interventions: Breast feeding basics reviewed;Assisted with latch;Hand express;Breast compression;Adjust position;Support pillows;Position options;Education;LC Services  brochure  Discharge Discharge Education: Warning signs for feeding baby Pump: DEBP;Personal  Consult Status Consult Status: Follow-up Date: 04/29/23 Follow-up type: In-patient    Dema Severin BS, IBCLC 04/28/2023, 6:06 PM

## 2023-04-28 NOTE — Progress Notes (Signed)
Labor Progress Note Jenna Jacobson is a 27 y.o. G1P0 at [redacted]w[redacted]d presented for SOL.   S: Laying on her right side. S/p epidural. Feeling increased pressure even between contractions. Has had trouble with position changes and having decels. Infant likes right lateral position  O:  BP 130/78   Pulse 70   Temp 98 F (36.7 C) (Oral)   Resp 17   Ht 5\' 5"  (1.651 m)   Wt 115.2 kg   LMP 07/31/2022 (Approximate)   SpO2 100%   BMI 42.27 kg/m  EFM: 135/moderate/+accels, no decels  CVE: Dilation: 10 Dilation Complete Date: 04/28/23 Dilation Complete Time: 0430 Effacement (%): 100 Cervical Position: Anterior Station: Plus 1 Presentation: Vertex Exam by:: Lyndel Safe MD   A&P: 27 y.o. G1P0 [redacted]w[redacted]d  SOL #Labor: Progressing well. Not on pitocin. Practice push was effective. RN will start pushing with patient.  #Pain: epidural #FWB: Cat 1 #GBS negative  Federico Flake, MD 5:35 AM

## 2023-04-29 LAB — CBC
HCT: 26.5 % — ABNORMAL LOW (ref 36.0–46.0)
Hemoglobin: 8.4 g/dL — ABNORMAL LOW (ref 12.0–15.0)
MCH: 27.5 pg (ref 26.0–34.0)
MCHC: 31.7 g/dL (ref 30.0–36.0)
MCV: 86.9 fL (ref 80.0–100.0)
Platelets: 138 10*3/uL — ABNORMAL LOW (ref 150–400)
RBC: 3.05 MIL/uL — ABNORMAL LOW (ref 3.87–5.11)
RDW: 16.1 % — ABNORMAL HIGH (ref 11.5–15.5)
WBC: 11.7 10*3/uL — ABNORMAL HIGH (ref 4.0–10.5)
nRBC: 0 % (ref 0.0–0.2)

## 2023-04-29 NOTE — Lactation Note (Signed)
This note was copied from a baby's chart. Lactation Consultation Note  Patient Name: Jenna Jacobson [id number] Date: 04/29/2023 Age:29 hours Reason for consult: Follow-up assessment;Term  P1, 40 wks. LC rounds, Mom fed formula last feed. Discussed expectations today as baby wakes up more, shows more feeding cues. Encouraged mom LC is here to help as needed- with pumping or breastfeeding. Bedside pump set up by previous shift- at bedside. Mom has home pump in route.  Maternal Data Has patient been taught Hand Expression?: Yes Does the patient have breastfeeding experience prior to this delivery?: No  Feeding Mother's Current Feeding Choice: Breast Milk and Formula Nipple Type: Slow - flow   Interventions Interventions: Education  Discharge Pump: DEBP;Personal (Per mom- home pump has been ordered, is on its way)  Consult Status Consult Status: Follow-up Date: 04/29/23 Follow-up type: In-patient    [facility] 04/29/2023, 8:45 AM

## 2023-04-29 NOTE — Progress Notes (Signed)
POSTPARTUM PROGRESS NOTE  Subjective: Jenna Jacobson is a 27 y.o. G1P1001 s/p VAVD at [redacted]w[redacted]d.  She reports she is doing well. No acute events overnight. She denies any problems with ambulating, voiding or po intake. Denies nausea or vomiting. She has passed flatus. She has not had bowel movement. Pain is well controlled.  Lochia is Minimal.  Urine is slightly red tinged, likely some mild bladder trauma. No dizziness, dyspnea, or chest palpitations.  Objective: BP 113/69   Pulse 85   Temp 98.1 F (36.7 C)   Resp 17   Ht 5\' 5"  (1.651 m)   Wt 115.2 kg   LMP 07/31/2022 (Approximate)   SpO2 100%   Breastfeeding Unknown   BMI 42.27 kg/m   Physical Exam:  General: alert, cooperative and no distress Chest: CTAB, no respiratory distress Abdomen: soft, non-tender  Uterine Fundus: firm, appropriately tender, below umbilicus Extremities: No calf swelling, tenderness, or edema  Recent Labs    04/27/23 2248 04/29/23 0412  HGB 10.9* 8.4*  HCT 34.3* 26.5*    Assessment/Plan: Jenna Jacobson is a 27 y.o. G1P1001 s/p VAVD at [redacted]w[redacted]d after experiencing maternal exhaustion in labor.   LOS: 2 days   PPD#1: Doing well, pain well-controlled.  -- Routine postpartum care, lactation support -- Encouraged up OOB -- Contraception: natural family planning (NFP) -- Feeding: breast feeding -- Circumcision: yes  Iron deficiency anemia: Hgb 10.9>8.4.  Clinically significant.  Asymptomatic.  Continue PO iron.  Dispo: Plan for discharge tomorrow.  Jenna Dalpe Sharion Dove, MD [facility] PGY-1 OB Faculty Practice 04/29/23 6:30 AM

## 2023-04-29 NOTE — Progress Notes (Signed)
Provided sitz bath and explained to patient and support person how to use and frequency. Jenna Jacobson, [name]

## 2023-04-29 NOTE — Anesthesia Postprocedure Evaluation (Signed)
Anesthesia Post Note  Patient: Chief Financial Officer  Procedure(s) Performed: AN AD HOC LABOR EPIDURAL     Patient location during evaluation: Mother Baby Anesthesia Type: Epidural Level of consciousness: awake, awake and alert and oriented Pain management: pain level controlled Vital Signs Assessment: post-procedure vital signs reviewed and stable Respiratory status: spontaneous breathing, nonlabored ventilation and respiratory function stable Cardiovascular status: blood pressure returned to baseline and stable Postop Assessment: no headache, no backache, patient able to bend at knees, no apparent nausea or vomiting, adequate PO intake and able to ambulate Anesthetic complications: no   No notable events documented.  Last Vitals:  Vitals:   04/29/23 0002 04/29/23 0344  BP: 123/66 113/69  Pulse: 78 85  Resp: 16 17  Temp: 37 C 36.7 C  SpO2:      Last Pain:  Vitals:   04/28/23 2007  TempSrc: Oral  PainSc: 0-No pain   Pain Goal:                   Jenna Jacobson

## 2023-04-30 MED ORDER — ACETAMINOPHEN 325 MG PO TABS: 650.0000 mg | ORAL_TABLET | ORAL | Status: AC | PRN

## 2023-04-30 MED ORDER — IBUPROFEN 600 MG PO TABS: 600.0000 mg | ORAL_TABLET | Freq: Four times a day (QID) | ORAL | 0 refills | Status: AC

## 2023-04-30 MED ORDER — BISACODYL 10 MG RE SUPP: 10.0000 mg | Freq: Every day | RECTAL | Status: AC | PRN

## 2023-04-30 MED ORDER — SENNOSIDES-DOCUSATE SODIUM 8.6-50 MG PO TABS: 2.0000 | ORAL_TABLET | ORAL | Status: AC

## 2023-04-30 MED ORDER — WITCH HAZEL-GLYCERIN EX PADS: 1.0000 | MEDICATED_PAD | CUTANEOUS | Status: AC | PRN

## 2023-04-30 MED ORDER — COCONUT OIL OIL: 1.0000 | TOPICAL_OIL | Status: AC | PRN

## 2023-04-30 MED ORDER — SIMETHICONE 80 MG PO CHEW: 80.0000 mg | CHEWABLE_TABLET | ORAL | Status: AC | PRN

## 2023-05-01 LAB — SURGICAL PATHOLOGY

## 2023-05-03 ENCOUNTER — Inpatient Hospital Stay (HOSPITAL_COMMUNITY): Admission: RE | Admit: 2023-05-03 | Payer: Medicaid Other | Source: Home / Self Care | Admitting: Family Medicine

## 2023-05-03 ENCOUNTER — Inpatient Hospital Stay (HOSPITAL_COMMUNITY): Payer: Medicaid Other

## 2023-05-09 ENCOUNTER — Encounter: Payer: Self-pay | Admitting: General Practice

## 2023-05-12 ENCOUNTER — Telehealth (HOSPITAL_COMMUNITY): Payer: Self-pay

## 2023-05-12 NOTE — Telephone Encounter (Signed)
05/12/2023 1004  Name: Jenna Jacobson MRN: [id number] DOB: 06-27-1995  Reason for Call:  Transition of Care Hospital Discharge Call  Contact Status: Patient Contact Status: Complete  Language assistant needed:          Follow-Up Questions: Do You Have Any Concerns About Your Health As You Heal From Delivery?: No Do You Have Any Concerns About Your Infants Health?: No  Edinburgh Postnatal Depression Scale:  In the Past 7 Days: I have been able to laugh and see the funny side of things.: As much as I always could I have looked forward with enjoyment to things.: As much as I ever did I have blamed myself unnecessarily when things went wrong.: No, never I have been anxious or worried for no good reason.: No, not at all I have felt scared or panicky for no good reason.: No, not at all Things have been getting on top of me.: No, I have been coping as well as ever I have been so unhappy that I have had difficulty sleeping.: Not at all I have felt sad or miserable.: No, not at all I have been so unhappy that I have been crying.: No, never The thought of harming myself has occurred to me.: Never Inocente Salles Postnatal Depression Scale Total: 0  PHQ2-9 Depression Scale:     Discharge Follow-up: Edinburgh score requires follow up?: No Patient was advised of the following resources:: Breastfeeding Support Group, Support Group  Post-discharge interventions: Reviewed Newborn Safe Sleep Practices  Signature  Signe Colt

## 2023-05-12 NOTE — ED Provider Notes (Signed)
[facility]    CSN: [id number] Arrival date & time: 04/13/23  1478      History   Chief Complaint Chief Complaint  Patient presents with   Otalgia    HPI Edit Jenna Jacobson is a 27 y.o. female.   Patient here today for evaluation of right ear fullness.  She states she was here about a week ago for same and has been using Debrox drops without full resolution.  She denies any fever.  The history is provided by the patient.  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    Patient Active Problem List   Diagnosis Date Noted   Vacuum-assisted vaginal delivery 04/28/2023   Obstetric vaginal laceration with type 3a third degree perineal laceration 04/28/2023   Post-dates pregnancy 04/27/2023   Obesity affecting pregnancy, antepartum 03/08/2023   Anemia affecting pregnancy in third trimester 02/07/2023   Supervision of low-risk first pregnancy 11/15/2022    Past Surgical History:  Procedure Laterality Date   NO PAST SURGERIES      OB History     Gravida  1   Para  1   Term  1   Preterm      AB      Living  1      SAB      IAB      Ectopic      Multiple  0   Live Births  1            Home Medications    Prior to Admission medications   Medication Sig Start Date End Date Taking? Authorizing Provider  acetaminophen (TYLENOL) 325 MG tablet Take 2 tablets (650 mg total) by mouth every 4 (four) hours as needed (for pain scale < 4). 04/30/23   Wyn Forster, MD  bisacodyl (DULCOLAX) 10 MG suppository Place 1 suppository (10 mg total) rectally daily as needed for moderate constipation. 04/30/23   Wyn Forster, MD  coconut oil OIL Apply 1 Application topically as needed. 04/30/23   Wyn Forster, MD  ferrous sulfate 324 (65 Fe) MG TBEC Take 1 tablet (325 mg total)  by mouth every other day. 01/27/23   Federico Flake, MD  ibuprofen (ADVIL) 600 MG tablet Take 1 tablet (600 mg total) by mouth every 6 (six) hours. 04/30/23   Wyn Forster, MD  Prenatal Vit-Fe Fumarate-FA (PRENATAL MULTIVITAMIN) TABS tablet Take 1 tablet by mouth daily.    [provider]  senna-docusate (SENOKOT-S) 8.6-50 MG tablet Take 2 tablets by mouth daily. 04/30/23   Wyn Forster, MD  simethicone (MYLICON) 80 MG chewable tablet Chew 1 tablet (80 mg total) by mouth as needed for flatulence. 04/30/23   Wyn Forster, MD  witch hazel-glycerin (TUCKS) pad Apply 1 Application topically as needed for hemorrhoids. 04/30/23   Wyn Forster, MD    Family History Family History  Problem Relation Age of Onset   Hypertension Mother    Obesity Mother    Hypertension Father    Healthy Brother     Social History Social History   Tobacco Use   Smoking status: Never   Smokeless tobacco: Never  Vaping Use   Vaping status: Never Used  Substance Use Topics   Alcohol use: Not Currently   Drug use: Not Currently    Types: Marijuana  Allergies   Patient has no known allergies.   Review of Systems Review of Systems  Constitutional:  Negative for chills and fever.  HENT:  Negative for congestion, ear pain and sore throat.   Eyes:  Negative for discharge and redness.  Gastrointestinal:  Negative for abdominal pain, nausea and vomiting.     Physical Exam Triage Vital Signs ED Triage Vitals  Encounter Vitals Group     BP 04/13/23 0848 124/80     Systolic BP Percentile --      Diastolic BP Percentile --      Pulse Rate 04/13/23 0848 86     Resp 04/13/23 0848 16     Temp 04/13/23 0848 98.2 F (36.8 C)     Temp Source 04/13/23 0848 Oral     SpO2 04/13/23 0848 98 %     Weight --      Height --      Head Circumference --      Peak Flow --      Pain Score 04/13/23 0849 0     Pain Loc --      Pain Education --      Exclude from Growth Chart --    No  data found.  Updated Vital Signs BP 124/80 (BP Location: Left Arm)   Pulse 86   Temp 98.2 F (36.8 C) (Oral)   Resp 16   LMP 07/31/2022 (Approximate)   SpO2 98%      Physical Exam Vitals and nursing note reviewed.  Constitutional:      General: She is not in acute distress.    Appearance: Normal appearance. She is not ill-appearing.  HENT:     Head: Normocephalic and atraumatic.     Right Ear: There is impacted cerumen.     Left Ear: Tympanic membrane and ear canal normal.  Eyes:     Conjunctiva/sclera: Conjunctivae normal.  Cardiovascular:     Rate and Rhythm: Normal rate.  Pulmonary:     Effort: Pulmonary effort is normal. No respiratory distress.  Neurological:     Mental Status: She is alert.  Psychiatric:        Mood and Affect: Mood normal.        Behavior: Behavior normal.        Thought Content: Thought content normal.      UC Treatments / Results  Labs (all labs ordered are listed, but only abnormal results are displayed) Labs Reviewed - No data to display  EKG   Radiology No results found.  Procedures Procedures (including critical care time)  Medications Ordered in UC Medications - No data to display  Initial Impression / Assessment and Plan / UC Course  I have reviewed the triage vital signs and the nursing notes.  Pertinent labs & imaging results that were available during my care of the patient were reviewed by me and considered in my medical decision making (see chart for details).    Mild improvement after additional irrigation.   Recommended continue Debrox at home.  Encouraged follow-up with any further concerns.  Final Clinical Impressions(s) / UC Diagnoses   Final diagnoses:  Hearing loss due to cerumen impaction, right   Discharge Instructions   None    ED Prescriptions   None    PDMP not reviewed this encounter.   Tomi Bamberger, PA-C 05/12/23 [phone]

## 2023-05-29 NOTE — Progress Notes (Unsigned)
Post Partum Visit Note  Jenna Jacobson is a 27 y.o. G32P1001 female who presents for a postpartum visit. She is 4 weeks postpartum following a vacuum-assisted vaginal delivery.  I have fully reviewed the prenatal and intrapartum course. The delivery was at 40.2 gestational weeks.  Anesthesia: epidural. Postpartum course has been good. Baby is doing well. Baby is feeding by bottle - Similac Advance. Bleeding no bleeding. Bowel function is normal. Bladder function is normal. Patient is not sexually active. Contraception method is none. Postpartum depression screening: negative   The pregnancy intention screening data noted above was reviewed. Potential methods of contraception were discussed. The patient elected to proceed with none   Edinburgh Postnatal Depression Scale - 05/30/23 1432       Edinburgh Postnatal Depression Scale:  In the Past 7 Days   I have been able to laugh and see the funny side of things. 3    I have looked forward with enjoyment to things. 3    I have blamed myself unnecessarily when things went wrong. 0    I have been anxious or worried for no good reason. 0    I have felt scared or panicky for no good reason. 0    Things have been getting on top of me. 0    I have been so unhappy that I have had difficulty sleeping. 0    I have felt sad or miserable. 0    I have been so unhappy that I have been crying. 0    The thought of harming myself has occurred to me. 0    Edinburgh Postnatal Depression Scale Total 6             There are no preventive care reminders to display for this patient.  The following portions of the patient's history were reviewed and updated as appropriate: allergies, current medications, past family history, past medical history, past social history, past surgical history, and problem list.  Review of Systems Pertinent items noted in HPI and remainder of comprehensive ROS otherwise negative.  Objective:  BP 127/84   Pulse 90   Wt 226  lb 7 oz (102.7 kg)   LMP 07/31/2022 (Approximate)   BMI 37.68 kg/m    General:  alert, cooperative, and appears stated age   Breasts:  not indicated  Lungs: Comfortalbe on room air  Wound N/a  GU exam:  not indicated        Assessment:   1. Postpartum care and examination   2. History of vacuum extraction assisted delivery   3. Anemia affecting pregnancy in third trimester  - CBC   Plan:   Essential components of care per ACOG recommendations:  1.  Mood and well being: Patient with negative depression screening today. Reviewed local resources for support.  - Patient tobacco use? No.   - hx of drug use? No.    2. Infant care and feeding:  -Patient currently breastmilk feeding?  Bottle and pumping   -Social determinants of health (SDOH) reviewed in EPIC. No concerns  3. Sexuality, contraception and birth spacing - Patient  not  want a pregnancy in the next year.  Desired family size is 2 children.  - Reviewed reproductive life planning. Reviewed contraceptive methods based on pt preferences and effectiveness.  Patient desired  none .   - Discussed birth spacing of 18 months  4. Sleep and fatigue -Encouraged family/partner/community support of 4 hrs of uninterrupted sleep to help with mood and  fatigue  5. Physical Recovery  - Discussed patients delivery and complications. She describes her labor as good. - Patient had a Vaginal, no problems at delivery. Patient had a 3rd degree laceration. Perineal healing reviewed. Patient expressed understanding - Patient has urinary incontinence? No. - Patient is safe to resume physical and sexual activity  6.  Health Maintenance - HM due items addressed no, UTD - Last pap smear  Diagnosis  Date Value Ref Range Status  11/23/2022   Final   - Negative for intraepithelial lesion or malignancy (NILM)   Pap smear not done at today's visit.  -Breast Cancer screening indicated? No.   7. Chronic Disease/Pregnancy Condition follow  up: Anemia   Albertine Grates, FNP [facility] [employer]

## 2023-05-30 ENCOUNTER — Other Ambulatory Visit: Payer: Self-pay

## 2023-05-30 ENCOUNTER — Ambulatory Visit: Payer: Medicaid Other | Admitting: Obstetrics and Gynecology

## 2023-05-30 DIAGNOSIS — Z8759 Personal history of other complications of pregnancy, childbirth and the puerperium: Secondary | ICD-10-CM | POA: Insufficient documentation

## 2023-05-30 DIAGNOSIS — O99013 Anemia complicating pregnancy, third trimester: Secondary | ICD-10-CM

## 2023-05-31 LAB — CBC
Hematocrit: 35.9 % (ref 34.0–46.6)
Hemoglobin: 11.5 g/dL (ref 11.1–15.9)
MCH: 27.4 pg (ref 26.6–33.0)
MCHC: 32 g/dL (ref 31.5–35.7)
MCV: 86 fL (ref 79–97)
Platelets: 227 10*3/uL (ref 150–450)
RBC: 4.2 x10E6/uL (ref 3.77–5.28)
RDW: 14.5 % (ref 11.7–15.4)
WBC: 6.1 10*3/uL (ref 3.4–10.8)

## 2023-06-05 ENCOUNTER — Ambulatory Visit: Payer: Medicaid Other | Admitting: Family Medicine
